# Patient Record
Sex: Female | Born: 1940 | Race: White | Hispanic: No | Marital: Married | Smoking: Never smoker
Health system: Southern US, Community
[De-identification: ages and names within clinical notes are randomized; demographics above are authoritative.]

## PROBLEM LIST (undated history)

## (undated) DIAGNOSIS — E559 Vitamin D deficiency, unspecified: Secondary | ICD-10-CM

## (undated) DIAGNOSIS — H409 Unspecified glaucoma: Secondary | ICD-10-CM

## (undated) DIAGNOSIS — E039 Hypothyroidism, unspecified: Secondary | ICD-10-CM

## (undated) DIAGNOSIS — F329 Major depressive disorder, single episode, unspecified: Secondary | ICD-10-CM

## (undated) DIAGNOSIS — I10 Essential (primary) hypertension: Secondary | ICD-10-CM

## (undated) DIAGNOSIS — F32A Depression, unspecified: Secondary | ICD-10-CM

## (undated) DIAGNOSIS — U071 COVID-19: Secondary | ICD-10-CM

## (undated) HISTORY — DX: Vitamin D deficiency, unspecified: E55.9

## (undated) HISTORY — DX: COVID-19: U07.1

## (undated) HISTORY — DX: Essential (primary) hypertension: I10

## (undated) HISTORY — PX: GLAUCOMA SURGERY: SHX656

## (undated) HISTORY — DX: Unspecified glaucoma: H40.9

## (undated) HISTORY — PX: OTHER SURGICAL HISTORY: SHX169

---

## 2001-10-23 ENCOUNTER — Other Ambulatory Visit: Admission: RE | Admit: 2001-10-23 | Discharge: 2001-10-23 | Payer: Self-pay | Admitting: Obstetrics and Gynecology

## 2002-10-15 ENCOUNTER — Ambulatory Visit (HOSPITAL_COMMUNITY): Admission: RE | Admit: 2002-10-15 | Discharge: 2002-10-15 | Payer: Self-pay | Admitting: Gastroenterology

## 2002-10-15 ENCOUNTER — Encounter (INDEPENDENT_AMBULATORY_CARE_PROVIDER_SITE_OTHER): Payer: Self-pay | Admitting: Specialist

## 2002-10-29 ENCOUNTER — Other Ambulatory Visit: Admission: RE | Admit: 2002-10-29 | Discharge: 2002-10-29 | Payer: Self-pay | Admitting: Obstetrics and Gynecology

## 2003-10-31 ENCOUNTER — Other Ambulatory Visit: Admission: RE | Admit: 2003-10-31 | Discharge: 2003-10-31 | Payer: Self-pay | Admitting: Obstetrics and Gynecology

## 2004-11-12 ENCOUNTER — Other Ambulatory Visit: Admission: RE | Admit: 2004-11-12 | Discharge: 2004-11-12 | Payer: Self-pay | Admitting: Obstetrics and Gynecology

## 2005-11-29 ENCOUNTER — Other Ambulatory Visit: Admission: RE | Admit: 2005-11-29 | Discharge: 2005-11-29 | Payer: Self-pay | Admitting: Obstetrics & Gynecology

## 2005-12-13 ENCOUNTER — Encounter: Admission: RE | Admit: 2005-12-13 | Discharge: 2005-12-13 | Payer: Self-pay | Admitting: Internal Medicine

## 2006-11-11 DIAGNOSIS — E559 Vitamin D deficiency, unspecified: Secondary | ICD-10-CM

## 2006-11-11 HISTORY — DX: Vitamin D deficiency, unspecified: E55.9

## 2006-12-07 ENCOUNTER — Other Ambulatory Visit: Admission: RE | Admit: 2006-12-07 | Discharge: 2006-12-07 | Payer: Self-pay | Admitting: Obstetrics & Gynecology

## 2006-12-12 HISTORY — PX: REFRACTIVE SURGERY: SHX103

## 2007-01-19 ENCOUNTER — Ambulatory Visit (HOSPITAL_COMMUNITY): Admission: RE | Admit: 2007-01-19 | Discharge: 2007-01-19 | Payer: Self-pay | Admitting: Ophthalmology

## 2007-04-16 ENCOUNTER — Encounter: Admission: RE | Admit: 2007-04-16 | Discharge: 2007-05-02 | Payer: Self-pay | Admitting: Occupational Medicine

## 2007-12-20 ENCOUNTER — Other Ambulatory Visit: Admission: RE | Admit: 2007-12-20 | Discharge: 2007-12-20 | Payer: Self-pay | Admitting: Obstetrics and Gynecology

## 2011-01-28 NOTE — Op Note (Signed)
NAME:  Andrea Henderson, Andrea Henderson                               ACCOUNT NO.:  [id number]   MEDICAL RECORD NO.:  [id number]                   PATIENT TYPE:  AMB   LOCATION:  ENDO                                 FACILITY:  [facility]   PHYSICIAN:  Anselmo Rod, M.D.               DATE OF BIRTH:  12/03/1940   DATE OF PROCEDURE:  10/15/2002  DATE OF DISCHARGE:                                 OPERATIVE REPORT   PROCEDURE PERFORMED:  Colonoscopy with snare polypectomy x1.   ENDOSCOPIST:  Anselmo Rod, M.D.   INSTRUMENT USED:  Olympus video colonoscope.   INDICATION FOR PROCEDURE:  [age]-year-old white female underwent  screening colonoscopy.  The patient has a family history of ovarian cancer  in a paternal aunt and has a personal history of constipation, predominant  IBS, to rule out colon polyps, masses, etc.   PREPROCEDURE PREPARATION:  Informed consent was procured from the patient.  The patient was fasted for eight hours prior to the procedure and prepped  with a bottle of MiraLax and Gatorade the night prior to the procedure.   PREPROCEDURE PHYSICAL:  VITAL SIGNS:  The patient had stable vital signs.  NECK:  Neck supple.  CHEST:  Chest clear to auscultation.  S1 and S2 regular.  ABDOMEN:  Abdomen soft with normal bowel sounds.   DESCRIPTION OF THE PROCEDURE:  The patient was placed in the left lateral  decubitus position and sedated with 50 mg of Demerol and 5 mg of Versed  intravenously.  Once the patient was adequately sedate and maintained on low-  flow oxygen and continuous cardiac monitoring, the Olympus video colonoscope  was advanced from the rectum to the cecum.  There was some residual stool in  the colon and multiple washes were done.  A small sessile polyp was snared  from the proximal right colon.  The rest of the colonic mucosa appeared  healthy and without lesions.  In areas where there was some residual stool,  very small lesions could have been missed.  The terminal  ileum appeared  healthy and without lesions.  The appendiceal orifice and ileocecal valve  were clearly visualized and photographed.  Small internal hemorrhoids were  seen on retroflexion.   IMPRESSION:  1. Small sessile polyps snared from proximal right colon.  2. Small nonbleeding internal hemorrhoids.  3. Otherwise normal colonoscopy up to terminal ileum.   RECOMMENDATIONS:  1. Await pathology results.  2.     Avoid all nonsteroidals including aspirin.  3. High fiber diet with liberal fluid intake.  4. Outpatient followup in the next two weeks for further recommendations.                                               Jyothi  Elsie Amis, M.D.    JNM/MEDQ  D:  10/15/2002  T:  10/15/2002  Job:  [id number]   cc:   Olene Craven, M.D.  [address]  [address]  [place]  [place] [address]  Fax: [phone]

## 2011-09-04 ENCOUNTER — Emergency Department (HOSPITAL_COMMUNITY)
Admission: EM | Admit: 2011-09-04 | Discharge: 2011-09-05 | Disposition: A | Discharge status: Home / Self Care | Payer: Medicare Other | Attending: Emergency Medicine | Admitting: Emergency Medicine

## 2011-09-04 DIAGNOSIS — R0989 Other specified symptoms and signs involving the circulatory and respiratory systems: Secondary | ICD-10-CM | POA: Insufficient documentation

## 2011-09-04 DIAGNOSIS — J9801 Acute bronchospasm: Secondary | ICD-10-CM | POA: Insufficient documentation

## 2011-09-04 DIAGNOSIS — H409 Unspecified glaucoma: Secondary | ICD-10-CM | POA: Insufficient documentation

## 2011-09-04 DIAGNOSIS — R0600 Dyspnea, unspecified: Secondary | ICD-10-CM

## 2011-09-04 DIAGNOSIS — R062 Wheezing: Secondary | ICD-10-CM | POA: Insufficient documentation

## 2011-09-04 DIAGNOSIS — R059 Cough, unspecified: Secondary | ICD-10-CM | POA: Insufficient documentation

## 2011-09-04 DIAGNOSIS — R0602 Shortness of breath: Secondary | ICD-10-CM | POA: Insufficient documentation

## 2011-09-04 DIAGNOSIS — R05 Cough: Secondary | ICD-10-CM | POA: Insufficient documentation

## 2011-09-04 DIAGNOSIS — F3289 Other specified depressive episodes: Secondary | ICD-10-CM | POA: Insufficient documentation

## 2011-09-04 DIAGNOSIS — Z79899 Other long term (current) drug therapy: Secondary | ICD-10-CM | POA: Insufficient documentation

## 2011-09-04 DIAGNOSIS — R0609 Other forms of dyspnea: Secondary | ICD-10-CM | POA: Insufficient documentation

## 2011-09-04 DIAGNOSIS — F329 Major depressive disorder, single episode, unspecified: Secondary | ICD-10-CM | POA: Insufficient documentation

## 2011-09-04 DIAGNOSIS — E039 Hypothyroidism, unspecified: Secondary | ICD-10-CM | POA: Insufficient documentation

## 2011-09-04 DIAGNOSIS — J3489 Other specified disorders of nose and nasal sinuses: Secondary | ICD-10-CM | POA: Insufficient documentation

## 2011-09-04 DIAGNOSIS — R0601 Orthopnea: Secondary | ICD-10-CM | POA: Insufficient documentation

## 2011-09-04 HISTORY — DX: Hypothyroidism, unspecified: E03.9

## 2011-09-04 HISTORY — DX: Depression, unspecified: F32.A

## 2011-09-04 HISTORY — DX: Major depressive disorder, single episode, unspecified: F32.9

## 2011-09-04 NOTE — ED Notes (Signed)
Pt just completed tamiflu and z pack last week

## 2011-09-04 NOTE — ED Notes (Signed)
Pt reports shortness of breath, cough, and intermittent episodes of dizziness since last Wednesday - pt states her PCP dx her w/ the flu over the phone and prescribed tamiflu. Pt w/ non-productive cough and fever last Wednesday. Pt reports some numbness to her legs, fingers, and lips that began this afternoon. Pt resting comfortably on bed in no acute distress, family at bedside.

## 2011-09-04 NOTE — ED Notes (Signed)
Dx with flu last week by PCP- pt repeat SOB since yesterday- denies chest pain

## 2011-09-05 ENCOUNTER — Emergency Department (HOSPITAL_COMMUNITY): Payer: Medicare Other

## 2011-09-05 MED ORDER — BENZONATATE 100 MG PO CAPS
200.0000 mg | ORAL_CAPSULE | Freq: Once | ORAL | Status: AC
  Administered 2011-09-05: 200 mg via ORAL

## 2011-09-05 MED ORDER — ACETAMINOPHEN 325 MG PO TABS
650.0000 mg | ORAL_TABLET | Freq: Once | ORAL | Status: AC
Start: 2011-09-05 — End: 2011-09-05
  Administered 2011-09-05: 650 mg via ORAL
  Filled 2011-09-05: qty 2

## 2011-09-05 MED ORDER — PREDNISONE 20 MG PO TABS
60.0000 mg | ORAL_TABLET | Freq: Once | ORAL | Status: AC
  Administered 2011-09-05: 60 mg via ORAL
  Filled 2011-09-05: qty 3

## 2011-09-05 MED ORDER — ALBUTEROL SULFATE (5 MG/ML) 0.5% IN NEBU
5.0000 mg | INHALATION_SOLUTION | Freq: Once | RESPIRATORY_TRACT | Status: AC
  Administered 2011-09-05: 5 mg via RESPIRATORY_TRACT
  Filled 2011-09-05 (×2): qty 1

## 2011-09-05 MED ORDER — HYDROCOD POLST-CHLORPHEN POLST 10-8 MG/5ML PO LQCR: 5.0000 mL | Freq: Once | ORAL | Status: DC

## 2011-09-05 MED ORDER — BENZONATATE 100 MG PO CAPS
ORAL_CAPSULE | ORAL | Status: AC
  Filled 2011-09-05: qty 2

## 2011-09-05 MED ORDER — ALBUTEROL SULFATE HFA 108 (90 BASE) MCG/ACT IN AERS
2.0000 | INHALATION_SPRAY | Freq: Once | RESPIRATORY_TRACT | Status: AC
  Administered 2011-09-05: 2 via RESPIRATORY_TRACT
  Filled 2011-09-05: qty 6.7

## 2011-09-05 MED ORDER — PREDNISONE 10 MG PO TABS: 20.0000 mg | ORAL_TABLET | Freq: Every day | ORAL | Status: DC

## 2011-09-05 MED ORDER — IPRATROPIUM BROMIDE 0.02 % IN SOLN
0.5000 mg | Freq: Once | RESPIRATORY_TRACT | Status: AC
  Administered 2011-09-05: 0.5 mg via RESPIRATORY_TRACT
  Filled 2011-09-05 (×2): qty 2.5

## 2011-09-05 NOTE — ED Notes (Signed)
Rx given x1 D/c instructions reviewed w/ pt and family - pt and family deny any further questions or concerns at present.  

## 2011-09-05 NOTE — ED Provider Notes (Signed)
History     CSN: [id number]  Arrival date & time 09/04/11  2308   First MD Initiated Contact with Patient 09/04/11 2335      Chief Complaint  Patient presents with  . Shortness of Breath  . URI  . Influenza    (Consider location/radiation/quality/duration/timing/severity/associated sxs/prior treatment) HPI Comments: Patient here with continued cough and shortness of breath - states was seen by PCP last week, diagnosed with bronchitis and placed on a z-pack - states this had initially helped but since yesterday she reports worsening barking cough and shortness of breath - has noticed also with wheezing - she denies chest pain, recent trips, calf pain, nausea, vomiting.  Patient is a 70 y.o. female presenting with shortness of breath, URI, and flu symptoms. The history is provided by the patient. No language interpreter was used.  Shortness of Breath  The current episode started more than 2 weeks ago. The onset was gradual. The problem occurs continuously. The problem has been gradually worsening. The problem is moderate. The symptoms are relieved by nothing. The symptoms are aggravated by nothing. Associated symptoms include orthopnea, rhinorrhea, cough, shortness of breath and wheezing. Pertinent negatives include no chest pain, no chest pressure, no fever, no sore throat and no stridor. There was no intake of a foreign body. She was not exposed to toxic fumes. She has not inhaled smoke recently. She has had no prior steroid use. She has had no prior hospitalizations. She has had no prior ICU admissions. She has had no prior intubations. She has been behaving normally. Urine output has been normal. The last void occurred less than 6 hours ago. There were no sick contacts. Recently, medical care has been given by the PCP. Services received include medications given.  URI The primary symptoms include cough and wheezing. Primary symptoms do not include fever or sore throat.  Symptoms associated  with the illness include rhinorrhea.  Influenza Associated symptoms include coughing. Pertinent negatives include no chest pain, fever or sore throat.    Past Medical History  Diagnosis Date  . Glaucoma   . Hypothyroid   . Depression     History reviewed. No pertinent past surgical history.  History reviewed. No pertinent family history.  History  Substance Use Topics  . Smoking status: Passive Smoker  . Smokeless tobacco: Not on file  . Alcohol Use: No    OB History    Grav Para Term Preterm Abortions TAB SAB Ect Mult Living                  Review of Systems  Constitutional: Negative for fever.  HENT: Positive for rhinorrhea. Negative for sore throat.   Respiratory: Positive for cough, shortness of breath and wheezing. Negative for stridor.   Cardiovascular: Positive for orthopnea. Negative for chest pain.  All other systems reviewed and are negative.    Allergies  Codeine and Sulfa antibiotics  Home Medications   Current Outpatient Rx  Name Route Sig Dispense Refill  . INDOMETHACIN 25 MG PO CAPS Oral Take 25 mg by mouth 2 (two) times daily with a meal.      . LEVOTHYROXINE SODIUM 88 MCG PO TABS Oral Take 88 mcg by mouth daily.      . SERTRALINE HCL 100 MG PO TABS Oral Take 100 mg by mouth daily.        BP 99/59  Pulse 73  Temp(Src) 97.5 F (36.4 C) (Oral)  Resp 17  Wt 130 lb (58.968 kg)  SpO2 100%  Physical Exam  Nursing note and vitals reviewed. Constitutional: She is oriented to person, place, and time. She appears well-developed and well-nourished. No distress.  HENT:  Head: Normocephalic and atraumatic.  Right Ear: External ear normal.  Left Ear: External ear normal.  Nose: Rhinorrhea present.  Mouth/Throat: Oropharynx is clear and moist. No oropharyngeal exudate.  Eyes: Conjunctivae are normal. Pupils are equal, round, and reactive to light. No scleral icterus.  Neck: Normal range of motion. Neck supple.  Cardiovascular: Normal rate,  regular rhythm and normal heart sounds.  Exam reveals no gallop and no friction rub.   No murmur heard. Pulmonary/Chest: Effort normal. No respiratory distress. She has wheezes. She has no rales. She exhibits no tenderness.  Abdominal: Soft. Bowel sounds are normal. She exhibits no distension. There is no tenderness.  Musculoskeletal: Normal range of motion.  Lymphadenopathy:    She has no cervical adenopathy.  Neurological: She is alert and oriented to person, place, and time. No cranial nerve deficit.  Skin: Skin is warm and dry.  Psychiatric: She has a normal mood and affect. Her behavior is normal. Judgment and thought content normal.    ED Course  Procedures (including critical care time)  Labs Reviewed - No data to display Dg Chest 2 View  09/05/2011  *RADIOLOGY REPORT*  Clinical Data: Cough, fever and shortness of breath for 3 days.  CHEST - 2 VIEW  Comparison: Chest radiograph performed 12/13/2005  Findings: The lungs are well-aerated.  Mild chronically increased interstitial markings are noted.  There is no evidence of focal opacification, pleural effusion or pneumothorax.  Calcified granulomata are again noted bilaterally.  The heart is normal in size; the mediastinal contour is within normal limits.  No acute osseous abnormalities are seen.  IMPRESSION: No acute cardiopulmonary process seen; mild chronic lung changes noted.  Original Report Authenticated By: Tonia Ghent, M.D.     Bronchospasm     MDM  Chest x-ray does not reveal CAP or any focal findings, oxygen saturations are normal - reports improvement in breathing and cough with breathing treatment leading me to believe this to be more of a bronchospasm type picture - will place on albuterol and start on prednisone for the next several days - she is to return with any worrisome symptoms.        Izola Price [place], [place] 09/06/11 [phone]

## 2011-09-07 NOTE — ED Provider Notes (Signed)
Medical screening examination/treatment/procedure(s) were performed by non-physician practitioner and as supervising physician I was immediately available for consultation/collaboration.  Ethelda Chick, MD 09/07/11 [phone]

## 2011-12-22 ENCOUNTER — Other Ambulatory Visit (HOSPITAL_COMMUNITY): Payer: Self-pay | Admitting: Radiology

## 2011-12-27 ENCOUNTER — Ambulatory Visit (HOSPITAL_COMMUNITY)
Admission: RE | Admit: 2011-12-27 | Discharge: 2011-12-27 | Disposition: A | Discharge status: Home / Self Care | Payer: Medicare Other | Source: Ambulatory Visit | Attending: Internal Medicine | Admitting: Internal Medicine

## 2011-12-27 DIAGNOSIS — R05 Cough: Secondary | ICD-10-CM | POA: Insufficient documentation

## 2011-12-27 DIAGNOSIS — J45909 Unspecified asthma, uncomplicated: Secondary | ICD-10-CM | POA: Insufficient documentation

## 2011-12-27 DIAGNOSIS — R059 Cough, unspecified: Secondary | ICD-10-CM | POA: Insufficient documentation

## 2011-12-27 MED ORDER — ALBUTEROL SULFATE (5 MG/ML) 0.5% IN NEBU
2.5000 mg | INHALATION_SOLUTION | Freq: Once | RESPIRATORY_TRACT | Status: AC
  Administered 2011-12-27: 2.5 mg via RESPIRATORY_TRACT

## 2012-05-21 ENCOUNTER — Encounter: Payer: Self-pay | Admitting: Cardiovascular Disease

## 2013-01-25 LAB — HM COLONOSCOPY

## 2013-02-12 ENCOUNTER — Encounter: Payer: Self-pay | Admitting: Nurse Practitioner

## 2013-02-21 ENCOUNTER — Encounter: Payer: Self-pay | Admitting: *Deleted

## 2013-03-07 ENCOUNTER — Ambulatory Visit: Payer: Self-pay | Admitting: Nurse Practitioner

## 2013-03-11 ENCOUNTER — Ambulatory Visit: Payer: Self-pay | Admitting: Nurse Practitioner

## 2013-03-27 ENCOUNTER — Ambulatory Visit (INDEPENDENT_AMBULATORY_CARE_PROVIDER_SITE_OTHER): Payer: Medicare Other | Admitting: Nurse Practitioner

## 2013-03-27 ENCOUNTER — Encounter: Payer: Self-pay | Admitting: Nurse Practitioner

## 2013-03-27 VITALS — BP 118/78 | HR 64 | Resp 14 | Ht 61.0 in | Wt 142.0 lb

## 2013-03-27 DIAGNOSIS — Z01419 Encounter for gynecological examination (general) (routine) without abnormal findings: Secondary | ICD-10-CM

## 2013-03-27 NOTE — Progress Notes (Signed)
72 y.o. G2P2 Married Caucasian Fe here for annual exam.  Feels well. Celebrated 44 th Birthday [place] past weekend with a family gathering.  Grandson who was living with her has now move to [facility]. She has been very sad about the situation and had   No LMP recorded. Patient is postmenopausal.          Sexually active: no  The current method of family planning is status post menopausal..    Exercising: no  not regularly is apart of the [employer]; does water aerobics Smoker:  no  Health Maintenance: Pap:  03/01/12 normal MMG:  01/29/13 normal Colonoscopy:  01/25/13 normal recheck 10 yrs BMD:   01/24/12- Stable TDaP:  10/19/2002 Shingles: 2-3 years ago Labs: PCP and all test normal including Vit D - now Rx given by PCP   reports that she has been passively smoking.  She does not have any smokeless tobacco history on file. She reports that she does not drink alcohol or use illicit drugs.  Past Medical History  Diagnosis Date  . Glaucoma   . Hypothyroid   . Depression   . Glaucoma     Past Surgical History  Procedure Laterality Date  . Refractive surgery Bilateral 12/2006    Current Outpatient Prescriptions  Medication Sig Dispense Refill  . Ascorbic Acid (VITAMIN C) 100 MG tablet Take 100 mg by mouth daily.      Marland Kitchen b complex vitamins tablet Take 1 tablet by mouth daily.      . calcium carbonate (OS-CAL) 600 MG TABS Take 600 mg by mouth 2 (two) times daily with a meal.      . estradiol-norethindrone (ACTIVELLA) 1-0.5 MG per tablet Take 1 tablet by mouth daily.      . fish oil-omega-3 fatty acids 1000 MG capsule Take 2 g by mouth daily.      Marland Kitchen glucosamine-chondroitin 500-400 MG tablet Take 1 tablet by mouth 3 (three) times daily.      . indapamide (LOZOL) 1.25 MG tablet Take 1.25 mg by mouth every morning.      . indomethacin (INDOCIN) 25 MG capsule Take 25 mg by mouth 2 (two) times daily with a meal.        . levothyroxine (SYNTHROID, LEVOTHROID) 88 MCG tablet Take 88 mcg by mouth  daily.        . Multiple Vitamin (MULTIVITAMIN) tablet Take 1 tablet by mouth daily.      . predniSONE (DELTASONE) 10 MG tablet Take 2 tablets (20 mg total) by mouth daily.  16 tablet  0  . sertraline (ZOLOFT) 100 MG tablet Take 100 mg by mouth daily.        . Vitamin D, Ergocalciferol, (DRISDOL) 50000 UNITS CAPS Take 50,000 Units by mouth every 7 (seven) days.      . vitamin E 100 UNIT capsule Take 100 Units by mouth daily.       No current facility-administered medications for this visit.    Family History  Problem Relation Age of Onset  . Stroke Mother     CVA  . AAA (abdominal aortic aneurysm) Father     ROS:  Pertinent items are noted in HPI.  Otherwise, a comprehensive ROS was negative.  Exam:   There were no vitals taken for this visit.    Ht Readings from Last 3 Encounters:  No data found for Ht    General appearance: alert, cooperative and appears stated age Head: Normocephalic, without obvious abnormality, atraumatic Neck: no adenopathy,  supple, symmetrical, trachea midline and thyroid normal to inspection and palpation Lungs: clear to auscultation bilaterally Breasts: normal appearance, no masses or tenderness Heart: regular rate and rhythm Abdomen: soft, non-tender; no masses,  no organomegaly Extremities: extremities normal, atraumatic, no cyanosis or edema Skin: Skin color, texture, turgor normal. No rashes or lesions Lymph nodes: Cervical, supraclavicular, and axillary nodes normal. No abnormal inguinal nodes palpated Neurologic: Grossly normal   Pelvic: External genitalia:  no lesions              Urethra:  normal appearing urethra with no masses, tenderness or lesions              Bartholin's and Skene's: normal                 Vagina: normal appearing vagina with normal color and discharge, no lesions              Cervix: anteverted              Pap taken: no Bimanual Exam:  Uterus:  normal size, contour, position, consistency, mobility, non-tender               Adnexa: no mass, fullness, tenderness               Rectovaginal: Confirms               Anus:  normal sphincter tone, no lesions  A:  Well Woman with normal exam  Postmenopausal  Vit D deficiency - now followed by PCP  Situational anxiety depression  Hypothyroid  P:   Pap smear as per guidelines   Mammogram due 01/2014  counseled on breast self exam, adequate intake of calcium and vitamin D,   diet and exercise, Kegel's exercises return annually or prn  An After Visit Summary was printed and given to the patient.

## 2013-03-27 NOTE — Patient Instructions (Signed)

## 2013-03-28 NOTE — Progress Notes (Signed)
Encounter reviewed by Dr. Brook Silva.  

## 2013-12-30 ENCOUNTER — Other Ambulatory Visit: Payer: Self-pay | Admitting: Internal Medicine

## 2013-12-30 DIAGNOSIS — M542 Cervicalgia: Secondary | ICD-10-CM

## 2014-01-06 ENCOUNTER — Ambulatory Visit
Admission: RE | Admit: 2014-01-06 | Discharge: 2014-01-06 | Disposition: A | Discharge status: Home / Self Care | Payer: 59 | Source: Ambulatory Visit | Attending: Internal Medicine | Admitting: Internal Medicine

## 2014-01-06 DIAGNOSIS — M542 Cervicalgia: Secondary | ICD-10-CM

## 2014-02-12 ENCOUNTER — Telehealth: Payer: Self-pay | Admitting: *Deleted

## 2014-02-12 NOTE — Telephone Encounter (Signed)
I have attempted to contact this patient by phone with the following results: left message to return my call on answering machine (home).  

## 2014-02-13 NOTE — Telephone Encounter (Signed)
Pt returning call

## 2014-02-14 NOTE — Telephone Encounter (Signed)
I have attempted to contact this patient by phone with the following results: left message to return my call on answering machine (home).  

## 2014-02-25 NOTE — Telephone Encounter (Signed)
Pt notified of BMD results per written note on hardcopy.  She voices understanding and is agreeable with plan.   Hard copy sent to scan.  

## 2014-04-02 ENCOUNTER — Ambulatory Visit: Payer: MEDICARE | Admitting: Certified Nurse Midwife

## 2014-04-23 ENCOUNTER — Ambulatory Visit (INDEPENDENT_AMBULATORY_CARE_PROVIDER_SITE_OTHER): Payer: Medicare Other | Admitting: Nurse Practitioner

## 2014-04-23 ENCOUNTER — Encounter: Payer: Self-pay | Admitting: Nurse Practitioner

## 2014-04-23 VITALS — BP 94/60 | HR 68 | Ht 61.0 in | Wt 143.0 lb

## 2014-04-23 DIAGNOSIS — Z01419 Encounter for gynecological examination (general) (routine) without abnormal findings: Secondary | ICD-10-CM

## 2014-04-23 DIAGNOSIS — E559 Vitamin D deficiency, unspecified: Secondary | ICD-10-CM

## 2014-04-23 NOTE — Progress Notes (Signed)
Patient ID: Andrea Henderson, female   DOB: 10-24-1940, 73 y.o.   MRN: [id number] 73 y.o. G2P2 Married Caucasian Fe here for annual exam.  She feels well and was able to SUB for 3 months last year.  She is still missing her grandson [place] [facility] [place].  She has been able to see him but less often.  She has been less motivated to be as active this past year.  She did have a repeat BMD and had 5.6 % loss at her left hip.  PCP wants her to take Fosomax and she is opposed to taking at this time.  Patient's last menstrual period was 09/12/2000.           Sexually active: no   The current method of family planning is status post menopausal..     Exercising: not regularly Smoker:  no  Health Maintenance: Pap:  03/01/12 normal MMG:  01/30/14, 3D, normal Colonoscopy:  01/25/13 normal recheck 10 yrs BMD:   01/30/14 TR Score: spine -1.3; left hip neck -1.7; total -1.3 TD:  10/19/2002 Shingles: 2-3 years ago Labs:  12/2013 PCP and all test normal including Vit D - now Rx given by PCP    reports that she has been passively smoking.  She has never used smokeless tobacco. She reports that she does not drink alcohol or use illicit drugs.  Past Medical History  Diagnosis Date  . Glaucoma   . Depression   . Glaucoma   . Hypothyroid 20 yrs ago  . Vitamin D deficiency disease 3/08    Past Surgical History  Procedure Laterality Date  . Refractive surgery Bilateral 12/2006    Current Outpatient Prescriptions  Medication Sig Dispense Refill  . calcium carbonate (OS-CAL) 600 MG TABS Take 600 mg by mouth at bedtime.       . fish oil-omega-3 fatty acids 1000 MG capsule Take 2 g by mouth daily.      . fluticasone (FLONASE) 50 MCG/ACT nasal spray Place 2 sprays into both nostrils 2 (two) times daily.       Marland Kitchen. glucosamine-chondroitin 500-400 MG tablet Take 1 tablet by mouth 3 (three) times daily.      . indapamide (LOZOL) 1.25 MG tablet Take 1.25 mg by mouth every other day.       . levothyroxine  (SYNTHROID, LEVOTHROID) 88 MCG tablet Take 88 mcg by mouth daily.        . sertraline (ZOLOFT) 50 MG tablet Take 50 mg by mouth daily.      Marland Kitchen. UNABLE TO FIND Med Name: Isotonix- Includes Anti Oxidant Mixture, Multivitamin, B complex, Vitamin E      . Vitamin D, Ergocalciferol, (DRISDOL) 50000 UNITS CAPS Take 50,000 Units by mouth every 14 (fourteen) days.        No current facility-administered medications for this visit.    Family History  Problem Relation Age of Onset  . Stroke Mother     CVA  . Mitral valve prolapse Mother   . AAA (abdominal aortic aneurysm) Father   . Mitral valve prolapse Sister   . Atrial fibrillation Sister   . Breast cancer  47    neice    ROS:  Pertinent items are noted in HPI.  Otherwise, a comprehensive ROS was negative.  Exam:   BP 94/60  Pulse 68  Ht 5\' 1"  (1.549 m)  Wt 143 lb (64.864 kg)  BMI 27.03 kg/m2  LMP 09/12/2000 Height: 5\' 1"  (154.9 cm)  Ht Readings from  Last 3 Encounters:  04/23/14 5\' 1"  (1.549 m)  03/27/13 5\' 1"  (1.549 m)    General appearance: alert, cooperative and appears stated age Head: Normocephalic, without obvious abnormality, atraumatic Neck: no adenopathy, supple, symmetrical, trachea midline and thyroid normal to inspection and palpation Lungs: clear to auscultation bilaterally Breasts: normal appearance, no masses or tenderness Heart: regular rate and rhythm Abdomen: soft, non-tender; no masses,  no organomegaly Extremities: extremities normal, atraumatic, no cyanosis or edema Skin: Skin color, texture, turgor normal. No rashes or lesions Lymph nodes: Cervical, supraclavicular, and axillary nodes normal. No abnormal inguinal nodes palpated Neurologic: Grossly normal   Pelvic: External genitalia:  no lesions              Urethra:  normal appearing urethra with no masses, tenderness or lesions              Bartholin's and Skene's: normal                 Vagina: normal appearing vagina with normal color and discharge,  no lesions              Cervix: anteverted              Pap taken: No. Bimanual Exam:  Uterus:  normal size, contour, position, consistency, mobility, non-tender              Adnexa: no mass, fullness, tenderness               Rectovaginal: Confirms               Anus:  normal sphincter tone, no lesions  A:  Well Woman with normal exam  Postmenopausal off HRT 02/2011  Osteopenia  History of Vit D deficiency   P:   Reviewed health and wellness pertinent to exam  Pap smear not taken today  Mammogram is due 01/2015  She is now on an OTC vitamin and is getting about 2,000 IU of Vit d daily advised she can come off Vit D RX.  Counseled on breast self exam, mammography screening, adequate intake of calcium and vitamin D, diet and exercise return annually or prn  An After Visit Summary was printed and given to the patient.

## 2014-04-23 NOTE — Patient Instructions (Signed)

## 2014-04-27 NOTE — Progress Notes (Signed)
Encounter reviewed by Dr. Roselie Cirigliano Silva.  

## 2014-07-14 ENCOUNTER — Encounter: Payer: Self-pay | Admitting: Nurse Practitioner

## 2015-04-28 ENCOUNTER — Encounter: Payer: Self-pay | Admitting: Nurse Practitioner

## 2015-04-28 ENCOUNTER — Ambulatory Visit (INDEPENDENT_AMBULATORY_CARE_PROVIDER_SITE_OTHER): Payer: Medicare Other | Admitting: Nurse Practitioner

## 2015-04-28 VITALS — BP 112/70 | HR 60 | Resp 16 | Ht 61.25 in | Wt 142.0 lb

## 2015-04-28 DIAGNOSIS — Z01419 Encounter for gynecological examination (general) (routine) without abnormal findings: Secondary | ICD-10-CM | POA: Diagnosis not present

## 2015-04-28 DIAGNOSIS — Z0000 Encounter for general adult medical examination without abnormal findings: Secondary | ICD-10-CM | POA: Diagnosis not present

## 2015-04-28 LAB — POCT URINALYSIS DIPSTICK
Bilirubin, UA: NEGATIVE
Blood, UA: NEGATIVE
GLUCOSE UA: NEGATIVE
Ketones, UA: NEGATIVE
Leukocytes, UA: NEGATIVE
Nitrite, UA: NEGATIVE
Protein, UA: NEGATIVE
UROBILINOGEN UA: NEGATIVE
pH, UA: 7

## 2015-04-28 NOTE — Progress Notes (Signed)
74 y.o. G2P2 Married  Caucasian Fe here for annual exam. Doing well.  She has just had AEX with PCP and he has given her new BP med - she is concerned as her BP is very low at home.  She has decided she will monitor with a BP log and then consult with him again.  Her husband has had a mouth cancer and was to have more surgery when he was found to have heart disease and had 4 vessel bypass surgery in June.  The grandson and son have moved back to [place]. She has noted a slight incresase in urinary urgency.  Patient's last menstrual period was 09/12/2000.          Sexually active: No.  The current method of family planning is post menopausal status.    Exercising: No.  The patient does not participate in regular exercise at present. Smoker:  no  Health Maintenance: Pap:  03/01/12 Neg MMG:  01/2014 BIRADS1:neg Colonoscopy:  01/2013 Normal - repeat 10 years  BMD:   01/2014  TDaP:  2004 Shingles:  Age 58 Prevnar 13: is due 06/2015 Labs: PCP    Urine: negative   reports that she has been passively smoking.  She has never used smokeless tobacco. She reports that she does not drink alcohol or use illicit drugs.  Past Medical History  Diagnosis Date  . Glaucoma   . Depression   . Glaucoma   . Hypothyroid 20 yrs ago  . Vitamin D deficiency disease 3/08    Past Surgical History  Procedure Laterality Date  . Refractive surgery Bilateral 12/2006    Current Outpatient Prescriptions  Medication Sig Dispense Refill  . ALPRAZolam (XANAX) 0.25 MG tablet Take 0.25 mg by mouth at bedtime as needed for anxiety.    . calcium carbonate (OS-CAL) 600 MG TABS Take 600 mg by mouth at bedtime.     . [facility] TUSSIN DM COUGH/CHEST 10-200 MG/5ML LIQD TAKE 1 TEASPOON BY MOUTH UP TO 4 TIMES DAILY AS NEEDED FOR COUGH  0  . fish oil-omega-3 fatty acids 1000 MG capsule Take 2 g by mouth daily.    . fluticasone (FLONASE) 50 MCG/ACT nasal spray Place 2 sprays into both nostrils 2 (two) times daily.     Marland Kitchen  glucosamine-chondroitin 500-400 MG tablet Take 1 tablet by mouth 3 (three) times daily.    . indapamide (LOZOL) 1.25 MG tablet Take 1.25 mg by mouth every other day.     . levothyroxine (SYNTHROID, LEVOTHROID) 88 MCG tablet Take 88 mcg by mouth daily.      Marland Kitchen losartan-hydrochlorothiazide (HYZAAR) 50-12.5 MG per tablet Take 1 tablet by mouth daily.    Marland Kitchen omeprazole (PRILOSEC) 20 MG capsule Take by mouth 2 (two) times daily.  3  . sertraline (ZOLOFT) 50 MG tablet Take 50 mg by mouth daily.    Marland Kitchen UNABLE TO FIND Med Name: Isotonix- Includes Anti Oxidant Mixture, Multivitamin, B complex, Vitamin E    . Vitamin D, Ergocalciferol, (DRISDOL) 50000 UNITS CAPS Take 50,000 Units by mouth every 14 (fourteen) days.      No current facility-administered medications for this visit.    Family History  Problem Relation Age of Onset  . Stroke Mother     CVA  . Mitral valve prolapse Mother   . AAA (abdominal aortic aneurysm) Father   . Mitral valve prolapse Sister   . Atrial fibrillation Sister   . Breast cancer  47    neice    ROS:  Pertinent items are noted in HPI.  Otherwise, a comprehensive ROS was negative.  Exam:   BP 112/70 mmHg  Pulse 60  Resp 16  Ht 5' 1.25" (1.556 m)  Wt 142 lb (64.411 kg)  BMI 26.60 kg/m2  LMP 09/12/2000 Height: 5' 1.25" (155.6 cm) Ht Readings from Last 3 Encounters:  04/28/15 5' 1.25" (1.556 m)  04/23/14  (1.549 m)  03/27/13  (1.549 m)    General appearance: alert, cooperative and appears stated age Head: Normocephalic, without obvious abnormality, atraumatic Neck: no adenopathy, supple, symmetrical, trachea midline and thyroid normal to inspection and palpation Lungs: clear to auscultation bilaterally Breasts: normal appearance, no masses or tenderness Heart: regular rate and rhythm Abdomen: soft, non-tender; no masses,  no organomegaly Extremities: extremities normal, atraumatic, no cyanosis or edema Skin: Skin color, texture, turgor normal. No  rashes or lesions Lymph nodes: Cervical, supraclavicular, and axillary nodes normal. No abnormal inguinal nodes palpated Neurologic: Grossly normal   Pelvic: External genitalia:  no lesions              Urethra:  normal appearing urethra with no masses, tenderness or lesions              Bartholin's and Skene's: normal                 Vagina: normal appearing vagina with normal color and discharge, no lesions              Cervix: anteverted              Pap taken: Yes.   Bimanual Exam:  Uterus:  normal size, contour, position, consistency, mobility, non-tender              Adnexa: no mass, fullness, tenderness               Rectovaginal: Confirms               Anus:  normal sphincter tone, no lesions  Chaperone present: Yes  A:  Well Woman with normal exam  Postmenopausal off HRT 02/2011 Osteopenia History of Vit D deficiency   P:   Reviewed health and wellness pertinent to exam  Pap smear as above  Mammogram i s due 2017 - will get copy of one for this year from [facility] counseled on breast self exam, mammography screening, adequate intake of calcium and vitamin D, diet and exercise, Kegel's exercises return annually or prn  An After Visit Summary was printed and given to the patient.

## 2015-04-28 NOTE — Progress Notes (Signed)
Encounter reviewed by Dr. Ree Alcalde Amundson C. Silva.  

## 2015-04-28 NOTE — Patient Instructions (Signed)

## 2015-04-30 LAB — IPS PAP SMEAR ONLY

## 2015-05-06 ENCOUNTER — Telehealth: Payer: Self-pay | Admitting: Nurse Practitioner

## 2015-05-06 NOTE — Telephone Encounter (Signed)
Patient called back and is appreciative of Ms. Patty stayin gon top of this. She said she has a appointment with Dr. Eloise Harman in the near future and will get her tdap along with her flu shot done with his office.

## 2015-05-06 NOTE — Telephone Encounter (Signed)
Pt. Is called to inform her that Immunization record from Dr. Jarold Motto reveals that she has not had an update on tetanus since 2004.  She now needs TDaP.  She may return here for that update or get at PCP.

## 2016-04-27 ENCOUNTER — Telehealth: Payer: Self-pay | Admitting: Nurse Practitioner

## 2016-04-27 NOTE — Telephone Encounter (Signed)
Patient is asking to talk with Andrea CushingPatty Grubb's nurse before coming to her aex appointment 05/02/16. Patient may want to cancel after talking with the nurse.

## 2016-04-27 NOTE — Telephone Encounter (Signed)
Spoke with patient. Patient states that she is calling to check to see if she needs to keep her aex as scheduled. "I am 75 and I am not sure that I need to have an exam any more. I see an internist so I was not sure." Reports internist does not perform pelvic or breast exam yearly. Advised it is recommended that she have a yearly pelvic and breast exam even if pap is due every 3 years. Patient is agreeable and will keep her aex appointment as scheduled with Ria CommentPatricia Grubb, FNP on 05/02/2016.  Routing to provider for final review. Patient agreeable to disposition. Will close encounter.

## 2016-05-02 ENCOUNTER — Ambulatory Visit: Payer: Medicare Other | Admitting: Nurse Practitioner

## 2016-05-04 ENCOUNTER — Encounter: Payer: Self-pay | Admitting: Nurse Practitioner

## 2016-05-04 ENCOUNTER — Ambulatory Visit (INDEPENDENT_AMBULATORY_CARE_PROVIDER_SITE_OTHER): Payer: Medicare Other | Admitting: Nurse Practitioner

## 2016-05-04 ENCOUNTER — Encounter: Payer: Medicare Other | Admitting: Nurse Practitioner

## 2016-05-04 VITALS — BP 126/76 | HR 64 | Ht 61.0 in | Wt 136.0 lb

## 2016-05-04 DIAGNOSIS — M1990 Unspecified osteoarthritis, unspecified site: Secondary | ICD-10-CM

## 2016-05-04 DIAGNOSIS — Z01419 Encounter for gynecological examination (general) (routine) without abnormal findings: Secondary | ICD-10-CM

## 2016-05-04 DIAGNOSIS — Z0000 Encounter for general adult medical examination without abnormal findings: Secondary | ICD-10-CM | POA: Diagnosis not present

## 2016-05-04 DIAGNOSIS — E039 Hypothyroidism, unspecified: Secondary | ICD-10-CM | POA: Diagnosis not present

## 2016-05-04 NOTE — Patient Instructions (Signed)

## 2016-05-04 NOTE — Progress Notes (Signed)
Patient ID: Andrea Henderson, female   DOB: [birth year], 75 y.o.   MRN: [id number]  75 y.o. 442P2002 Married  Caucasian Fe here for annual exam.  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  Now on a short course of steroids.   Patient's last menstrual period was 09/12/2000.          Sexually active: No.  The current method of family planning is post menopausal status.    Exercising: No.  The patient does not participate in regular exercise at present. Smoker:  no  Health Maintenance: Pap: 04/28/15, Negative MMG: 6/9//2017 Bi Rad: 1 - normal Colonoscopy:  01/25/13 normal recheck 10 yrs BMD:   02/19/2016: T score spine -1.20; right hip neck -2.20; left hip neck -2.0;   01/30/14, T Score, -1.3 Spine / -1.7 Right Femur Neck / -1.4 Left Femur Neck TDaP:  10/19/2002 Shingles: age 75 Pneumonia: 12/03/10, ? Prevnar 13 Hep C and HIV: Not indicated due to age Labs: PCP takes care of all labs   reports that she is a non-smoker but has been exposed to tobacco smoke. She has never used smokeless tobacco. She reports that she does not drink alcohol or use drugs.  Past Medical History:  Diagnosis Date  . Depression   . Glaucoma   . Glaucoma   . Hypothyroid 20 yrs ago  . Vitamin D deficiency disease 3/08    Past Surgical History:  Procedure Laterality Date  . REFRACTIVE SURGERY Bilateral 12/2006    Current Outpatient Prescriptions  Medication Sig Dispense Refill  . ALPRAZolam (XANAX) 0.25 MG tablet Take 0.25 mg by mouth at bedtime as needed for anxiety.    . calcium carbonate (OS-CAL) 600 MG TABS Take 600 mg by mouth at bedtime.     . [facility] TUSSIN DM COUGH/CHEST 10-200 MG/5ML LIQD TAKE 1 TEASPOON BY MOUTH UP TO 4 TIMES DAILY AS NEEDED FOR COUGH  0  . fish oil-omega-3 fatty acids 1000 MG capsule Take 2 g by mouth daily.    . fluticasone (FLONASE) 50 MCG/ACT nasal spray Place 2 sprays into both nostrils 2 (two) times daily.     Marland Kitchen. glucosamine-chondroitin 500-400 MG tablet Take 1 tablet by  mouth 3 (three) times daily.    . indapamide (LOZOL) 1.25 MG tablet Take 1.25 mg by mouth every other day.     . levothyroxine (SYNTHROID, LEVOTHROID) 88 MCG tablet Take 88 mcg by mouth daily.      Marland Kitchen. losartan-hydrochlorothiazide (HYZAAR) 50-12.5 MG per tablet Take 1 tablet by mouth daily.    Marland Kitchen. omeprazole (PRILOSEC) 20 MG capsule Take by mouth 2 (two) times daily.  3  . sertraline (ZOLOFT) 50 MG tablet Take 50 mg by mouth daily.    Marland Kitchen. UNABLE TO FIND Med Name: Isotonix- Includes Anti Oxidant Mixture, Multivitamin, B complex, Vitamin E    . Vitamin D, Ergocalciferol, (DRISDOL) 50000 UNITS CAPS Take 50,000 Units by mouth every 14 (fourteen) days.      No current facility-administered medications for this visit.     Family History  Problem Relation Age of Onset  . Stroke Mother     CVA  . Mitral valve prolapse Mother   . AAA (abdominal aortic aneurysm) Father   . Mitral valve prolapse Sister   . Atrial fibrillation Sister   . Breast cancer  47    neice    ROS:  Pertinent items are noted in HPI.  Otherwise, a comprehensive ROS was negative.  Exam:  LMP 09/12/2000    Ht Readings from Last 3 Encounters:  04/28/15 5' 1.25" (1.556 m)  04/23/14 5\' 1"  (1.549 m)  03/27/13 5\' 1"  (1.549 m)    General appearance: alert, cooperative and appears stated age Head: Normocephalic, without obvious abnormality, atraumatic Neck: no adenopathy, supple, symmetrical, trachea midline and thyroid normal to inspection and palpation Lungs: clear to auscultation bilaterally Breasts: normal appearance, no masses or tenderness Heart: regular rate and rhythm Abdomen: soft, non-tender; no masses,  no organomegaly Extremities: extremities normal, atraumatic, no cyanosis or edema Skin: Skin color, texture, turgor normal. No rashes or lesions Lymph nodes: Cervical, supraclavicular, and axillary nodes normal. No abnormal inguinal nodes palpated Neurologic: Grossly normal   Pelvic: External genitalia:  no  lesions              Urethra:  normal appearing urethra with no masses, tenderness or lesions              Bartholin's and Skene's: normal                 Vagina: normal appearing vagina with normal color and discharge, no lesions              Cervix: anteverted              Pap taken: No. Bimanual Exam:  Uterus:  normal size, contour, position, consistency, mobility, non-tender              Adnexa: no mass, fullness, tenderness               Rectovaginal: Confirms               Anus:  normal sphincter tone, no lesions  Chaperone present: yes  A:  Well Woman with normal exam  Postmenopausal off HRT 02/2011 Osteopenia History of Vit D deficiency   P:   Reviewed health and wellness pertinent to exam  Pap smear as above  Mammogram is due 02/2017  Discussed BMD with pt while here - given exercise that she can do at home or gym. Counseled on breast self exam, mammography screening, adequate intake of calcium and vitamin D, diet and exercise return annually or prn  An After Visit Summary was printed and given to the patient.

## 2016-05-04 NOTE — Progress Notes (Signed)
This encounter was created in error - please disregard.

## 2016-05-04 NOTE — Progress Notes (Deleted)
Patient ID: Andrea Henderson, female   DOB: Sep 06, 1941, 75 y.o.   MRN: [id number]  75 y.o. G2P2002 Married  CaucasiYisroel Rammingan Fe here for annual exam.    Patient's last menstrual period was 09/12/2000.          Sexually active: {yes no:314532}  The current method of family planning is {contraception:315051}.    Exercising: {yes no:314532}  {types:19826} Smoker:  {YES [id number]:22349}  Health Maintenance: Pap: 04/28/15, Negative MMG: 02/03/15,3D, Bi-Rads 1:  Negative  Colonoscopy:  01/25/13 normal recheck 10 yrs BMD:   01/30/14, T Score, -1.3 Spine / -1.7 Right Femur Neck / -1.4 Left Femur Neck TDaP:  10/19/2002 Shingles: age 75 Pneumonia: 12/03/10, ? prevnar 13 Hep C and HIV: Not indicated due to age Labs: PCP takes care of all labs   reports that she is a non-smoker but has been exposed to tobacco smoke. She has never used smokeless tobacco. She reports that she does not drink alcohol or use drugs.  Past Medical History:  Diagnosis Date  . Depression   . Glaucoma   . Glaucoma   . Hypothyroid 20 yrs ago  . Vitamin D deficiency disease 3/08    Past Surgical History:  Procedure Laterality Date  . REFRACTIVE SURGERY Bilateral 12/2006    Current Outpatient Prescriptions  Medication Sig Dispense Refill  . ALPRAZolam (XANAX) 0.25 MG tablet Take 0.25 mg by mouth at bedtime as needed for anxiety.    . calcium carbonate (OS-CAL) 600 MG TABS Take 600 mg by mouth at bedtime.     . [facility]S TUSSIN DM COUGH/CHEST 10-200 MG/5ML LIQD TAKE 1 TEASPOON BY MOUTH UP TO 4 TIMES DAILY AS NEEDED FOR COUGH  0  . fish oil-omega-3 fatty acids 1000 MG capsule Take 2 g by mouth daily.    . fluticasone (FLONASE) 50 MCG/ACT nasal spray Place 2 sprays into both nostrils 2 (two) times daily.     Marland Kitchen. glucosamine-chondroitin 500-400 MG tablet Take 1 tablet by mouth 3 (three) times daily.    . indapamide (LOZOL) 1.25 MG tablet Take 1.25 mg by mouth every other day.     . levothyroxine (SYNTHROID, LEVOTHROID) 88 MCG tablet Take 88 mcg by mouth  daily.      Marland Kitchen. losartan-hydrochlorothiazide (HYZAAR) 50-12.5 MG per tablet Take 1 tablet by mouth daily.    Marland Kitchen. omeprazole (PRILOSEC) 20 MG capsule Take by mouth 2 (two) times daily.  3  . sertraline (ZOLOFT) 50 MG tablet Take 50 mg by mouth daily.    Marland Kitchen. UNABLE TO FIND Med Name: Isotonix- Includes Anti Oxidant Mixture, Multivitamin, B complex, Vitamin E    . Vitamin D, Ergocalciferol, (DRISDOL) 50000 UNITS CAPS Take 50,000 Units by mouth every 14 (fourteen) days.      No current facility-administered medications for this visit.     Family History  Problem Relation Age of Onset  . Stroke Mother     CVA  . Mitral valve prolapse Mother   . AAA (abdominal aortic aneurysm) Father   . Mitral valve prolapse Sister   . Atrial fibrillation Sister   . Breast cancer  47    neice    ROS:  Pertinent items are noted in HPI.  Otherwise, a comprehensive ROS was negative.  Exam:   LMP 09/12/2000    Ht Readings from Last 3 Encounters:  04/28/15 5' 1.25" (1.556 m)  04/23/14 5\' 1"  (1.549 m)  03/27/13 5\' 1"  (1.549 m)    General appearance: alert, cooperative and appears stated age Head: Normocephalic,  without obvious abnormality, atraumatic Neck: no adenopathy, supple, symmetrical, trachea midline and thyroid {EXAM; THYROID:18604} Lungs: clear to auscultation bilaterally Breasts: {Exam; breast:13139::"normal appearance, no masses or tenderness"} Heart: regular rate and rhythm Abdomen: soft, non-tender; no masses,  no organomegaly Extremities: extremities normal, atraumatic, no cyanosis or edema Skin: Skin color, texture, turgor normal. No rashes or lesions Lymph nodes: Cervical, supraclavicular, and axillary nodes normal. No abnormal inguinal nodes palpated Neurologic: Grossly normal   Pelvic: External genitalia:  no lesions              Urethra:  normal appearing urethra with no masses, tenderness or lesions              Bartholin's and Skene's: normal                 Vagina: normal  appearing vagina with normal color and discharge, no lesions              Cervix: {exam; cervix:14595}              Pap taken: {yes no:314532} Bimanual Exam:  Uterus:  {exam; uterus:12215}              Adnexa: {exam; adnexa:12223}               Rectovaginal: Confirms               Anus:  normal sphincter tone, no lesions  Chaperone present: ***  A:  Well Woman with normal exam  P:   Reviewed health and wellness pertinent to exam  Pap smear as above  {plan; gyn:5269::"mammogram","pap smear","return annually or prn"}  An After Visit Summary was printed and given to the patient.

## 2016-05-06 NOTE — Progress Notes (Signed)
Reviewed personally.  M. Suzanne Shamieka Gullo, MD.  

## 2016-07-16 LAB — GLUCOSE, POCT (MANUAL RESULT ENTRY): POC Glucose: 129 mg/dl — AB (ref 70–99)

## 2016-10-17 ENCOUNTER — Telehealth: Payer: Self-pay | Admitting: Nurse Practitioner

## 2016-10-17 ENCOUNTER — Ambulatory Visit (INDEPENDENT_AMBULATORY_CARE_PROVIDER_SITE_OTHER): Payer: Medicare Other | Admitting: Nurse Practitioner

## 2016-10-17 ENCOUNTER — Encounter: Payer: Self-pay | Admitting: Nurse Practitioner

## 2016-10-17 VITALS — BP 110/78 | HR 58 | Resp 14 | Ht 61.0 in | Wt 141.6 lb

## 2016-10-17 DIAGNOSIS — N644 Mastodynia: Secondary | ICD-10-CM | POA: Diagnosis not present

## 2016-10-17 DIAGNOSIS — I499 Cardiac arrhythmia, unspecified: Secondary | ICD-10-CM

## 2016-10-17 NOTE — Telephone Encounter (Signed)
Patient found a "soreness" in her breast under her right arm.  Not sure if she should get it looked at with us or have a mammogram or see her PCP.  Patient states there is no lump just sore.

## 2016-10-17 NOTE — Telephone Encounter (Signed)
Spoke with patient. Patient states that she began having soreness under her right arm close to her breast last week. Does not feel a lump in the breast. Denies swelling, redness, warmth, or nipple discharge. States the soreness if not worsening, but is not changing. Advised she will need to be seen in the office for a breast check with Ria CommentPatricia Grubb, FNP. Patient is agreeable. Appointment scheduled for today 10/17/2016 at 3 pm. Patient is agreeable to date and time.  Routing to provider for final review. Patient agreeable to disposition. Will close encounter.

## 2016-10-17 NOTE — Progress Notes (Signed)
Scheduled patient while in office for right diagnostic mamm[facility] on [facility] at 10:15 am. Patient declines earlier appointments due to times offered. [facility] representative is going to speak with radiology tech and contact the patient with earlier time if they can work her in during times she is available. Placed in mammogram hold.

## 2016-10-17 NOTE — Progress Notes (Signed)
Patient ID: Andrea Henderson, female   DOB: April 03, 1941, 76 y.o.   MRN: [id number]  Patient here for R breast pain. Noticed 2 weeks ago.   Subjective:   76 y.o. Married Caucasian female presents for evaluation of right axilla pain. Onset of the symptoms was 2 weeks ago. Patient sought evaluation because of breast tenderness.  Contributing factors include family hx on mother's side. Niece age 74 with breast cancer treated with surgery and chemo- survives.  Paternal aunt with OV cancer early 74's and passed.  Denies fatigue. Patient denies history of trauma, bites, or injuries. [facility] and was normal.  Previous evaluation has included previous US yrs ago which was then diagnosed as a cyst.  Nurse when doing VS noted an abnormal heart rate.  Pt denies chest pain, dyspnea, palpitations, etc.  She has seen a cardiologist in the past ?  Her husband sees one and she would like to see the same one - Dr. Jacinto HalimGanji.   Review of Systems Pertinent items are noted in HPI.@SUBJECTIVE    Objective:   General appearance: alert, cooperative and no distress Head: Normocephalic, without obvious abnormality, atraumatic Neck: no adenopathy, supple, symmetrical, trachea midline and thyroid not enlarged, symmetric, no tenderness/mass/nodules Back: symmetric, no curvature. ROM normal. No CVA tenderness. Lungs: clear to auscultation bilaterally Breasts: normal appearance, no masses or tenderness, positive findings: on the left.  Right Breast - with tenderness from the axilla to outer quadrant of right breast.  No mass can be felt and no nodes. Heart: regularly irregular rhythm, about every 7-9 beats there is either PAC or PVC.  No other murmur noted. Abdomen: normal findings: no organomegaly and soft, non-tender    Assessment:   ASSESSMENT:Patient is diagnosed with mastalgia    Patient with irregular heart rate.   Plan:   PLAN: The patient has a documented plan to follow with further care of a  diagnostic plan on 11/03/16 as she has some time restrictions.  Will also refer pt to cardiologist for further evaluation 2. PLAN: FOLLOW as needed

## 2016-10-17 NOTE — Patient Instructions (Signed)
Will schedule mammogram and US on right breast. Will schedule cardio eval with MD of your choice.

## 2016-10-18 ENCOUNTER — Encounter: Payer: Self-pay | Admitting: Nurse Practitioner

## 2016-10-18 NOTE — Progress Notes (Signed)
Encounter reviewed by Dr. Brook Amundson C. Silva.  

## 2016-10-30 ENCOUNTER — Emergency Department (HOSPITAL_COMMUNITY)
Admission: EM | Admit: 2016-10-30 | Discharge: 2016-10-31 | Disposition: A | Discharge status: Home / Self Care | Payer: Medicare Other | Attending: Emergency Medicine | Admitting: Emergency Medicine

## 2016-10-30 ENCOUNTER — Emergency Department (HOSPITAL_COMMUNITY): Payer: Medicare Other

## 2016-10-30 ENCOUNTER — Encounter (HOSPITAL_COMMUNITY): Payer: Self-pay | Admitting: Emergency Medicine

## 2016-10-30 DIAGNOSIS — Z7722 Contact with and (suspected) exposure to environmental tobacco smoke (acute) (chronic): Secondary | ICD-10-CM | POA: Diagnosis not present

## 2016-10-30 DIAGNOSIS — Z79899 Other long term (current) drug therapy: Secondary | ICD-10-CM | POA: Diagnosis not present

## 2016-10-30 DIAGNOSIS — E039 Hypothyroidism, unspecified: Secondary | ICD-10-CM | POA: Insufficient documentation

## 2016-10-30 DIAGNOSIS — Y999 Unspecified external cause status: Secondary | ICD-10-CM | POA: Insufficient documentation

## 2016-10-30 DIAGNOSIS — S0083XA Contusion of other part of head, initial encounter: Secondary | ICD-10-CM | POA: Diagnosis not present

## 2016-10-30 DIAGNOSIS — W1830XA Fall on same level, unspecified, initial encounter: Secondary | ICD-10-CM | POA: Insufficient documentation

## 2016-10-30 DIAGNOSIS — S8992XA Unspecified injury of left lower leg, initial encounter: Secondary | ICD-10-CM | POA: Diagnosis present

## 2016-10-30 DIAGNOSIS — S82832A Other fracture of upper and lower end of left fibula, initial encounter for closed fracture: Secondary | ICD-10-CM | POA: Insufficient documentation

## 2016-10-30 DIAGNOSIS — S0990XA Unspecified injury of head, initial encounter: Secondary | ICD-10-CM | POA: Insufficient documentation

## 2016-10-30 DIAGNOSIS — Y939 Activity, unspecified: Secondary | ICD-10-CM | POA: Diagnosis not present

## 2016-10-30 DIAGNOSIS — Y92007 Garden or yard of unspecified non-institutional (private) residence as the place of occurrence of the external cause: Secondary | ICD-10-CM | POA: Insufficient documentation

## 2016-10-30 MED ORDER — IBUPROFEN 400 MG PO TABS: 400.0000 mg | ORAL_TABLET | Freq: Four times a day (QID) | ORAL | 0 refills | Status: DC | PRN

## 2016-10-30 MED ORDER — OXYCODONE-ACETAMINOPHEN 5-325 MG PO TABS: 2.0000 | ORAL_TABLET | ORAL | 0 refills | Status: DC | PRN

## 2016-10-30 MED ORDER — IBUPROFEN 200 MG PO TABS
600.0000 mg | ORAL_TABLET | Freq: Once | ORAL | Status: AC
  Administered 2016-10-31: 600 mg via ORAL
  Filled 2016-10-30: qty 3

## 2016-10-30 MED ORDER — OXYCODONE-ACETAMINOPHEN 5-325 MG PO TABS
1.0000 | ORAL_TABLET | Freq: Once | ORAL | Status: AC
  Administered 2016-10-30: 1 via ORAL
  Filled 2016-10-30: qty 1

## 2016-10-30 NOTE — ED Triage Notes (Signed)
Pt tripped over drop-off between asphalt and grass in front yard; pt has bruise noted to right eyebrow; denies LOC; pt c/o left ankle pain; deformity noted; no active bleeding to forehead or ankle; pulses of left foot intact; denies other injury

## 2016-10-30 NOTE — ED Notes (Signed)
Bed: WA03 Expected date:  Expected time:  Means of arrival:  Comments: 76 yo F/ Fall
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
## 2016-10-31 MED ORDER — BACITRACIN ZINC 500 UNIT/GM EX OINT
TOPICAL_OINTMENT | CUTANEOUS | Status: AC
  Administered 2016-10-31: 1
  Filled 2016-10-31: qty 0.9

## 2016-12-12 ENCOUNTER — Telehealth: Payer: Self-pay | Admitting: *Deleted

## 2016-12-12 ENCOUNTER — Encounter: Payer: Self-pay | Admitting: Nurse Practitioner

## 2016-12-12 NOTE — Telephone Encounter (Signed)
Call to patient regarding cardiology referral. Per ROI, can leave detailed message. Left message to call back to sally or becky with update.

## 2016-12-12 NOTE — Telephone Encounter (Signed)
Andrea Henderson returned Andrea Henderson's call. Kennon Rounds unavailable at time of call. Spoke with Andrea Henderson. Reviewed attempts to contact. Andrea Henderson states she spoke with Britta Mccreedy at Dr Verl Dicker office twice and reviewed need to defer. Recently fell and broke her ankle. Just got her boot removed. Doing better. Will contact them to schedule in next couple of weeks. Note to [place]. Defer referral until last week of April to follow up.

## 2016-12-12 NOTE — Telephone Encounter (Signed)
Routing to Dr Edward Jolly for review while Alexia Freestone out of office.

## 2016-12-12 NOTE — Telephone Encounter (Signed)
Additional notes to previous phone call with patient. Notes she has checked her pulse regularly, including this morning, and reports no irregularities. Regarding her fall, she stated she does not believe it is related to her pulse. She stated she 'simply tripped and fell'. Notes she broke her ankle and hit her head. States she had a CT scan which showed no abnormalities.

## 2016-12-13 NOTE — Telephone Encounter (Signed)
I recommend that the patient see her PCP in follow up to her potential irregular heartbeat.  When she was seen at the hospital, she had a normal cardiac exam by auscultation by the ER physician.  I would hold off on the cardiology referral at this time.   Cc- Billie Ruddy

## 2016-12-13 NOTE — Telephone Encounter (Signed)
Left message to call Haruna Rohlfs at [phone]. 

## 2016-12-14 NOTE — Telephone Encounter (Signed)
Left message to call [name] at [phone]. 

## 2016-12-14 NOTE — Telephone Encounter (Signed)
Spoke with patient. Advised of message as seen below from Dr.Silva. Patient is agreeable and verbalizes understanding. States that she has an appointment with her PCP for a physical in May. Will keep this appointment for follow up.  Cc: Pricilla Loveless  Routing to provider for final review. Patient agreeable to disposition. Will close encounter.

## 2016-12-14 NOTE — Telephone Encounter (Signed)
Patient returning call.

## 2017-02-20 ENCOUNTER — Telehealth: Payer: Self-pay | Admitting: Nurse Practitioner

## 2017-02-20 NOTE — Telephone Encounter (Signed)
Call to patient to follow up regarding Cardiology referral. Initial referral placed in April and patient declined at that time due to other medical issues current at the time.   States she did follow through with her recommended referral and saw Dr. Jacinto HalimGanji last week. Notes they performed an EKG and "found a little something". She is scheduled this week for an Echocardiogram and a 'treadmill test'.   Patient notes appreciation for the follow up and will contact us with any needs.  Routing to provider for review. Will close encounter.

## 2017-03-27 ENCOUNTER — Telehealth: Payer: Self-pay | Admitting: Obstetrics and Gynecology

## 2017-03-27 NOTE — Telephone Encounter (Signed)
Left message for patient to call back and reschedule with another provider Shirlyn Goltz([name] appt)

## 2017-04-13 ENCOUNTER — Encounter: Payer: Self-pay | Admitting: Internal Medicine

## 2017-04-13 ENCOUNTER — Encounter (INDEPENDENT_AMBULATORY_CARE_PROVIDER_SITE_OTHER): Payer: Self-pay

## 2017-04-13 ENCOUNTER — Ambulatory Visit (INDEPENDENT_AMBULATORY_CARE_PROVIDER_SITE_OTHER): Payer: Medicare Other | Admitting: Internal Medicine

## 2017-04-13 VITALS — BP 100/64 | HR 77 | Ht 61.0 in | Wt 142.0 lb

## 2017-04-13 DIAGNOSIS — R55 Syncope and collapse: Secondary | ICD-10-CM

## 2017-04-13 NOTE — Patient Instructions (Addendum)
Medication Instructions:  Your physician recommends that you continue on your current medications as directed. Please refer to the Current Medication list given to you today.  Labwork: None ordered.  Testing/Procedures: None ordered.  Follow-Up:  Dr. Ladona Ridgelaylor recommends you have an implantable loop recorder.  This will monitor your heart and identify any arrhthymias.    We have May 12, 2017 available for the procedure.  Follow up with Dr. Ladona Ridgelaylor as needed.  Let us know if you want to have the loop implanted.  Any Other Special Instructions Will Be Listed Below (If Applicable).  If you decide you would like to go ahead with the procedure call me, Ancil BoozerJenny Charvez [place]hies RN @ [phone].  If you need a refill on your cardiac medications before your next appointment, please call your pharmacy.

## 2017-04-13 NOTE — Progress Notes (Signed)
HPI [name] is referred today by Dr. Jacinto HalimGanji for evaluation of syncope. The patient is not convinced she ever passed out but she does admit to a period of altered consciousness about 6 months ago where she went to the ground and does not think that she tripped. She has undergone extensive workup by Dr. Jacinto HalimGanji which is negative. She does not have bundle branch block with her 12 lead and does have only occaisional PVC's. Exercise stress test was low risk. She has had a recent diagnosis of an "irregular heart beat". No anginal symptoms. She admits to a 6 lb weight gain. Allergies  Allergen Reactions  . Codeine Other (See Comments)    Unknown reaction  . Sulfa Antibiotics Rash    Childhood reaction     Current Outpatient Prescriptions  Medication Sig Dispense Refill  . B Complex-C (B-COMPLEX WITH VITAMIN C) tablet Take 1 tablet by mouth daily.    . fish oil-omega-3 fatty acids 1000 MG capsule Take 1 g by mouth daily.     . fluticasone (FLONASE) 50 MCG/ACT nasal spray Place 2 sprays into both nostrils 2 (two) times daily.     Marland Kitchen. glucosamine-chondroitin 500-400 MG tablet Take 1 tablet by mouth 3 (three) times daily.    . indapamide (LOZOL) 1.25 MG tablet Take 1.25 mg by mouth 2 (two) times a week. On Saturday and Wednesday    . levothyroxine (SYNTHROID, LEVOTHROID) 88 MCG tablet Take 88 mcg by mouth daily.      Marland Kitchen. loratadine (CLARITIN) 10 MG tablet Take 10 mg by mouth daily.    . Nutritional Supplements (DHEA PO) Take 1 capsule by mouth every morning.    Marland Kitchen. omeprazole (PRILOSEC) 20 MG capsule Take 20 mg by mouth 2 (two) times daily.   3  . oxyCODONE-acetaminophen (PERCOCET) 5-325 MG tablet Take 2 tablets by mouth every 4 (four) hours as needed. 20 tablet 0  . sertraline (ZOLOFT) 50 MG tablet Take 50 mg by mouth daily.    . TURMERIC PO Take 1 capsule by mouth every morning.    . Vitamin D, Ergocalciferol, (DRISDOL) 50000 UNITS CAPS Take 50,000 Units by mouth every 14 (fourteen) days.      No  current facility-administered medications for this visit.      Past Medical History:  Diagnosis Date  . Depression   . Glaucoma   . Glaucoma   . Hypothyroid 20 yrs ago  . Vitamin D deficiency disease 3/08    ROS:   All systems reviewed and negative except as noted in the HPI.   Past Surgical History:  Procedure Laterality Date  . GLAUCOMA SURGERY    . REFRACTIVE SURGERY Bilateral 12/2006  . thumb repair       Family History  Problem Relation Age of Onset  . Stroke Mother        CVA  . Mitral valve prolapse Mother   . AAA (abdominal aortic aneurysm) Father   . Mitral valve prolapse Sister   . Atrial fibrillation Sister   . Breast cancer Unknown [id number]       neice     Social History   Social History  . Marital status: Married    Spouse name: N/A  . Number of children: N/A  . Years of education: N/A   Occupational History  . Not on file.   Social History Main Topics  . Smoking status: Passive Smoke Exposure - Never Smoker  . Smokeless tobacco: Never Used  .  Alcohol use No     Comment: rare  . Drug use: No  . Sexual activity: No   Other Topics Concern  . Not on file   Social History Narrative  . No narrative on file     BP 100/64   Pulse 77   Ht 5\' 1"  (1.549 m)   Wt 142 lb (64.4 kg)   LMP 09/12/2000   SpO2 97%   BMI 26.83 kg/m   Physical Exam:  Well appearing NAD HEENT: Unremarkable Neck:  No JVD, no thyromegally Lymphatics:  No adenopathy Back:  No CVA tenderness Lungs:  Clear HEART:  Regular rate rhythm, no murmurs, no rubs, no clicks Abd:  soft, positive bowel sounds, no organomegally, no rebound, no guarding Ext:  2 plus pulses, no edema, no cyanosis, no clubbing Skin:  No rashes no nodules Neuro:  CN II through XII intact, motor grossly intact  EKG - NSR with PVC's  Assess/Plan: 1. Syncope - the etiology is unclear. I have discussed the risks/benefits/goals/expectations of insertion of an ILR and she is willing to proceed. She  has preserved LV function and a low risk stress test. 2. PVC's - unclear if there is any relation to her syncopal episode. She undergo watchful waiting. If she has VT we will be able to see on her heart monitor.  Leonia ReevesGregg Kermitt Harjo,M.D.

## 2017-05-08 ENCOUNTER — Telehealth: Payer: Self-pay | Admitting: Internal Medicine

## 2017-05-08 ENCOUNTER — Ambulatory Visit: Payer: Medicare Other | Admitting: Nurse Practitioner

## 2017-05-08 ENCOUNTER — Telehealth: Payer: Self-pay | Admitting: Neurology

## 2017-05-08 NOTE — Telephone Encounter (Signed)
She is not a pt in our office.  Was returning my call in regarding her husband

## 2017-05-08 NOTE — Telephone Encounter (Signed)
Patient lmom that she was returning your call. I had no idea she was not your patient. thanks

## 2017-05-08 NOTE — Telephone Encounter (Signed)
Thy was returning your call. Thanks

## 2017-05-08 NOTE — Telephone Encounter (Signed)
New message   Pt is calling and stating that she is supposed to get a loop recorder and needs more information on it. She requests a call back from Dr. Bruna Potter nurse.

## 2017-05-08 NOTE — Telephone Encounter (Signed)
Call returned to Pt.  Pt trying to decide if she would like loop recorder or not.  After all Pt questions answered Pt has decided to undergo watchful waiting.  She states she will call office if she has any recurrent dizziness or presyncope.

## 2018-01-15 ENCOUNTER — Telehealth: Payer: Self-pay | Admitting: Obstetrics and Gynecology

## 2018-01-15 NOTE — Telephone Encounter (Signed)
Spoke with patient. Advised last pap was done 04/28/2015 and was normal. Due for a pap smear at this years aex in August. Patient would like to schedule. Aex scheduled for 05/03/2018 at 12:30 pm with Dr.Silva. Patient is agreeable to date and time.  Routing to provider for final review. Patient agreeable to disposition. Will close encounter.

## 2018-01-15 NOTE — Telephone Encounter (Signed)
Patient called requesting to speak with the nurse about her last pap smear results and when she is due for her next pap.

## 2018-03-01 ENCOUNTER — Telehealth: Payer: Self-pay | Admitting: *Deleted

## 2018-03-01 NOTE — Telephone Encounter (Signed)
Call placed to patient, advised of [facility] BMD results dated 02/21/18 per Dr. Edward JollySilva. Patient states she has not been contacted by Dr. Eloise HarmanPaterson regarding results.Offered patient OV to consult regarding BMD, patient declined. Patient will f/u with Dr. Silvano RuskPaterson's office first, [facility] Patient verbalizes understanding. Will await return call.

## 2018-03-01 NOTE — Telephone Encounter (Signed)
Spoke with patient. Patient request to see Dr. Edward JollySilva for BMD consult. Patient asking if consult could wait until AEX 05/03/18? Recommended earlier OV to discuss results. OV scheduled for 03/20/18 at 9am with Dr. Edward JollySilva. Patient declined earlier OV, will be out of town.   BMD results to scan.   Routing to provider for final review. Patient is agreeable to disposition. Will close encounter.

## 2018-03-01 NOTE — Telephone Encounter (Signed)
Patient returning call to Jill. °

## 2018-03-19 ENCOUNTER — Institutional Professional Consult (permissible substitution): Payer: Medicare Other | Admitting: Neurology

## 2018-03-20 ENCOUNTER — Other Ambulatory Visit: Payer: Self-pay

## 2018-03-20 ENCOUNTER — Encounter: Payer: Self-pay | Admitting: Obstetrics and Gynecology

## 2018-03-20 ENCOUNTER — Ambulatory Visit: Payer: Medicare Other | Admitting: Obstetrics and Gynecology

## 2018-03-20 VITALS — BP 114/66 | HR 76 | Resp 14 | Ht 61.0 in | Wt 143.0 lb

## 2018-03-20 DIAGNOSIS — M810 Age-related osteoporosis without current pathological fracture: Secondary | ICD-10-CM

## 2018-03-20 MED ORDER — RISEDRONATE SODIUM 150 MG PO TABS: 150.0000 mg | ORAL_TABLET | ORAL | 0 refills | Status: DC

## 2018-03-20 NOTE — Patient Instructions (Signed)
Risedronate tablets What is this medicine? RISEDRONATE (ris ED roe nate) reduces calcium loss from bones. It helps make healthy bone and to slow bone loss in patients with Paget's disease and osteoporosis. It may be used in others at risk for bone loss. This medicine may be used for other purposes; ask your health care provider or pharmacist if you have questions. COMMON BRAND NAME(S): Actonel What should I tell my health care provider before I take this medicine? They need to know if you have any of these conditions: -dental disease -esophagus, stomach, or intestine problems, like acid reflux or GERD -kidney disease -low blood calcium -problems sitting or standing for 30 minutes -trouble swallowing -an unusual or allergic reaction to risedronate, other medicines, foods, dyes, or preservatives -pregnant or trying to get pregnant -breast-feeding How should I use this medicine? You must take this medication exactly as directed or you will lower the amount of medicine you absorb into your body or you may cause your self harm. Take this medicine by mouth first thing in the morning, after you are up for the day. Do not eat or drink anything before you take this medicine. Swallow the tablets with a full glass (6 to 8 fluid ounces) of plain water. Do not take the tablets with any other drink. Do not chew or crush the tablet. After taking this medicine, do not eat breakfast, drink, or take any other medicines or vitamins for at least 30 minutes. Stand or sit up for at least 30 minutes after you take this medicine; do not lie down. Do not take your medicine more often than directed. Talk to your pediatrician regarding the use of this medicine in children. Special care may be needed. Overdosage: If you think you have taken too much of this medicine contact a poison control center or emergency room at once. NOTE: This medicine is only for you. Do not share this medicine with others. What if I miss a  dose? If you miss a dose, do not take it later in the day. Take your normal dose the next morning. Do not take double or extra doses. What may interact with this medicine? -antacids like aluminum hydroxide or magnesium hydroxide -aspirin -calcium supplements -iron supplements -NSAIDs, medicines for pain and inflammation, like ibuprofen or naproxen -thyroid hormones -vitamins with minerals This list may not describe all possible interactions. Give your health care provider a list of all the medicines, herbs, non-prescription drugs, or dietary supplements you use. Also tell them if you smoke, drink alcohol, or use illegal drugs. Some items may interact with your medicine. What should I watch for while using this medicine? Visit your doctor or health care professional for regular check ups. It may be some time before you see the benefit from this medicine. Your doctor or health care professional may order blood tests and other tests to see how you are doing. You should make sure you get enough calcium and vitamin D while you are taking this medicine, unless your doctor tells you not to. Discuss the foods you eat and the vitamins you take with your health care professional. Some people who take this medicine have severe bone, joint, and/or muscle pain. This medicine may also increase your risk for a broken thigh bone. Tell your doctor right away if you have pain in your upper leg or groin. Tell your doctor if you have any pain that does not go away or that gets worse. What side effects may I notice from receiving   this medicine? Side effects that you should report to your doctor or health care professional as soon as possible: -allergic reactions such as skin rash or itching, hives, swelling of the face, lips, throat, or tongue -black or tarry stools -changes in vision -heartburn or stomach pain -jaw pain, especially after dental work -pain or difficulty when swallowing -redness, blistering,  peeling, or loosening of the skin, including inside the mouth Side effects that usually do not require medical attention (report to your doctor or health care professional if they continue or are bothersome): -bone, muscle, or joint pain -changes in taste -diarrhea or constipation -eye pain or itching -headache -nausea or vomiting -stomach gas or fullness This list may not describe all possible side effects. Call your doctor for medical advice about side effects. You may report side effects to FDA at 1-800-FDA-1088. Where should I keep my medicine? Keep out of the reach of children. Store at room temperature between 20 and 25 degrees C (68 and 77 degrees F). Throw away any unused medicine after the expiration date. NOTE: This sheet is a summary. It may not cover all possible information. If you have questions about this medicine, talk to your doctor, pharmacist, or health care provider.  2018 Elsevier/Gold Standard (2015-10-01 09:18:09) Osteoporosis Osteoporosis is the thinning and loss of density in the bones. Osteoporosis makes the bones more brittle, fragile, and likely to break (fracture). Over time, osteoporosis can cause the bones to become so weak that they fracture after a simple fall. The bones most likely to fracture are the bones in the hip, wrist, and spine. What are the causes? The exact cause is not known. What increases the risk? Anyone can develop osteoporosis. You may be at greater risk if you have a family history of the condition or have poor nutrition. You may also have a higher risk if you are:  Female.  50 years old or older.  A smoker.  Not physically active.  White or Asian.  Slender.  What are the signs or symptoms? A fracture might be the first sign of the disease, especially if it results from a fall or injury that would not usually cause a bone to break. Other signs and symptoms include:  Low back and neck pain.  Stooped posture.  Height  loss.  How is this diagnosed? To make a diagnosis, your health care provider may:  Take a medical history.  Perform a physical exam.  Order tests, such as: ? A bone mineral density test. ? A dual-energy X-ray absorptiometry test.  How is this treated? The goal of osteoporosis treatment is to strengthen your bones to reduce your risk of a fracture. Treatment may involve:  Making lifestyle changes, such as: ? Eating a diet rich in calcium. ? Doing weight-bearing and muscle-strengthening exercises. ? Stopping tobacco use. ? Limiting alcohol intake.  Taking medicine to slow the process of bone loss or to increase bone density.  Monitoring your levels of calcium and vitamin D.  Follow these instructions at home:  Include calcium and vitamin D in your diet. Calcium is important for bone health, and vitamin D helps the body absorb calcium.  Perform weight-bearing and muscle-strengthening exercises as directed by your health care provider.  Do not use any tobacco products, including cigarettes, chewing tobacco, and electronic cigarettes. If you need help quitting, ask your health care provider.  Limit your alcohol intake.  Take medicines only as directed by your health care provider.  Keep all follow-up visits as directed   by your health care provider. This is important.  Take precautions at home to lower your risk of falling, such as: ? Keeping rooms well lit and clutter free. ? Installing safety rails on stairs. ? Using rubber mats in the bathroom and other areas that are often wet or slippery. Get help right away if: You fall or injure yourself. This information is not intended to replace advice given to you by your health care provider. Make sure you discuss any questions you have with your health care provider. Document Released: 06/08/2005 Document Revised: 02/01/2016 Document Reviewed: 02/06/2014 Elsevier Interactive Patient Education  2018 Elsevier Inc.  

## 2018-03-20 NOTE — Progress Notes (Signed)
GYNECOLOGY  VISIT   HPI: 77 y.o.   Married  Caucasian  female   G2P2002 with Patient's last menstrual period was 09/12/2000.   here for   BMD consult  BMD from [facility] 02/21/18 showing osteoporosis.  T score of left radius -2.9, right femur -2.0, left femur -1.9, and spine -1.1.  Spine and hips are stable.  FRAX model - 13% risk of major fracture and 3.1 5 risk of hip fracture.   Labs with PCP.  TSH 0.20.  Adjusting her thyroid medication.  Ca 9.0 Vit D 49.1.  No hx steroid use, colitis, weight loss surgery, smoking, ETOH use (occasional beer).   Does weight bearing exercise.  Takes vit D supplementation.  Calcium once per day, MVI and one serving per day.   Has reflux and was dx after she had eval for chronic cough.   Broke her left ankle Feb. 2018.  Stumbled on asphalt.   PCP - Dr. Jarold Motto.  GYNECOLOGIC HISTORY: Patient's last menstrual period was 09/12/2000. Contraception:  Post menopausal  Menopausal hormone therapy:   none Last mammogram:  02-21-18 BIRADS 2 benign   Last pap smear:   04-28-15 negative         OB History    Gravida  2   Para  2   Term  2   Preterm  0   AB  0   Living  2     SAB  0   TAB  0   Ectopic  0   Multiple  0   Live Births  2              There are no active problems to display for this patient.   Past Medical History:  Diagnosis Date  . Depression   . Glaucoma   . Glaucoma   . Hypothyroid 20 yrs ago  . Vitamin D deficiency disease 3/08    Past Surgical History:  Procedure Laterality Date  . GLAUCOMA SURGERY    . REFRACTIVE SURGERY Bilateral 12/2006  . thumb repair      Current Outpatient Medications  Medication Sig Dispense Refill  . CALCIUM PO Take by mouth.    . fish oil-omega-3 fatty acids 1000 MG capsule Take 1 g by mouth daily.     . fluticasone (FLONASE) 50 MCG/ACT nasal spray Place 2 sprays into both nostrils 2 (two) times daily.     Marland Kitchen glucosamine-chondroitin 500-400 MG tablet Take 1 tablet by  mouth 3 (three) times daily.    . indapamide (LOZOL) 1.25 MG tablet Take 1.25 mg by mouth 2 (two) times a week. On Saturday and Wednesday    . levothyroxine (SYNTHROID, LEVOTHROID) 88 MCG tablet Take 88 mcg by mouth daily.      Marland Kitchen loratadine (CLARITIN) 10 MG tablet Take 10 mg by mouth daily.    . Multiple Vitamins-Minerals (MULTIVITAMIN PO) Take by mouth.    . Nutritional Supplements (DHEA PO) Take 1 capsule by mouth every morning.    Marland Kitchen omeprazole (PRILOSEC) 20 MG capsule Take 20 mg by mouth 2 (two) times daily.   3  . sertraline (ZOLOFT) 50 MG tablet Take 50 mg by mouth daily.    . TURMERIC PO Take 1 capsule by mouth every morning.    . Vitamin D, Ergocalciferol, (DRISDOL) 50000 UNITS CAPS Take 50,000 Units by mouth every 14 (fourteen) days.      No current facility-administered medications for this visit.      ALLERGIES: Codeine and Sulfa antibiotics  Family History  Problem Relation Age of Onset  . Stroke Mother        CVA  . Mitral valve prolapse Mother   . AAA (abdominal aortic aneurysm) Father   . Mitral valve prolapse Sister   . Atrial fibrillation Sister   . Non-Hodgkin's lymphoma Sister   . Breast cancer Unknown [id number]       neice    Social History   Socioeconomic History  . Marital status: Married    Spouse name: Not on file  . Number of children: Not on file  . Years of education: Not on file  . Highest education level: Not on file  Occupational History  . Not on file  Social Needs  . Financial resource strain: Not on file  . Food insecurity:    Worry: Not on file    Inability: Not on file  . Transportation needs:    Medical: Not on file    Non-medical: Not on file  Tobacco Use  . Smoking status: Passive Smoke Exposure - Never Smoker  . Smokeless tobacco: Never Used  Substance and Sexual Activity  . Alcohol use: No    Comment: rare  . Drug use: No  . Sexual activity: Never    Partners: Male    Birth control/protection: Post-menopausal  Lifestyle  .  Physical activity:    Days per week: Not on file    Minutes per session: Not on file  . Stress: Not on file  Relationships  . Social connections:    Talks on phone: Not on file    Gets together: Not on file    Attends religious service: Not on file    Active member of club or organization: Not on file    Attends meetings of clubs or organizations: Not on file    Relationship status: Not on file  . Intimate partner violence:    Fear of current or ex partner: Not on file    Emotionally abused: Not on file    Physically abused: Not on file    Forced sexual activity: Not on file  Other Topics Concern  . Not on file  Social History Narrative  . Not on file    Review of Systems  Constitutional: Negative.   HENT: Positive for sinus pain.   Eyes: Negative.   Respiratory: Negative.   Cardiovascular: Negative.   Gastrointestinal: Negative.   Endocrine: Negative.   Genitourinary: Negative.        Loss of urine with cough and sneeze   Musculoskeletal: Positive for arthralgias.  Skin: Negative.   Allergic/Immunologic: Negative.   Neurological: Positive for headaches.  Hematological: Negative.   Psychiatric/Behavioral: Negative.     PHYSICAL EXAMINATION:    BP 114/66 (BP Location: Right Arm, Patient Position: Sitting, Cuff Size: Normal)   Pulse 76   Resp 14   Ht 5\' 1"  (1.549 m)   Wt 143 lb (64.9 kg)   LMP 09/12/2000   BMI 27.02 kg/m     General appearance: alert, cooperative and appears stated age   Chaperone was present for exam.  ASSESSMENT  Osteoporosis of radius. Increase risk fracture by FRAX.  Personal hx of fracture.  Controlled reflux.   PLAN  Bone density report discussed.  Counseled regarding osteoporosis, risk factors, treatment options (bisphosphonates, Evista, Calcitonin, Prolia, Forteo), and fall risk reduction. Discussed importance of weight bearing exercise and daily calcium 1200 mg daily and vit D.   Will check PTH, calcium, phosphorus, vit D  level. Anticipate  starting Actonel 150 mg monthly.  Instructed in use.  Patient does understand that osteoporosis is a silent disease until fracture occurs and understands that there is significant morbidity associated with osteoporotic fracture.  FU for annual exam and prn.    An After Visit Summary was printed and given to the patient.  __25____ minutes face to face time of which over 50% was spent in counseling.

## 2018-03-21 DIAGNOSIS — M810 Age-related osteoporosis without current pathological fracture: Secondary | ICD-10-CM | POA: Insufficient documentation

## 2018-03-21 LAB — PTH, INTACT AND CALCIUM
Calcium: 9.6 mg/dL (ref 8.7–10.3)
PTH: 38 pg/mL (ref 15–65)

## 2018-03-21 LAB — PHOSPHORUS: PHOSPHORUS: 3.2 mg/dL (ref 2.5–4.5)

## 2018-04-02 ENCOUNTER — Institutional Professional Consult (permissible substitution): Payer: Medicare Other | Admitting: Neurology

## 2018-04-17 ENCOUNTER — Encounter: Payer: Self-pay | Admitting: Obstetrics and Gynecology

## 2018-04-28 IMAGING — CT CT HEAD W/O CM
3 of 8 series · 13 of 47 positions shown, 16 images · non-contrast
Comparison: None.

CLINICAL DATA: 75-year-old female with fall

EXAM:
CT HEAD WITHOUT CONTRAST
CT CERVICAL SPINE WITHOUT CONTRAST
TECHNIQUE: Multidetector CT imaging of the head and cervical spine was
performed following the standard protocol without intravenous
contrast. Multiplanar CT image reconstructions of the cervical spine
were also generated.

[Series 6: sagittal · sagittal · 0.31mm/px · 2 of 74 slices shown]
[im 25/74  brain]
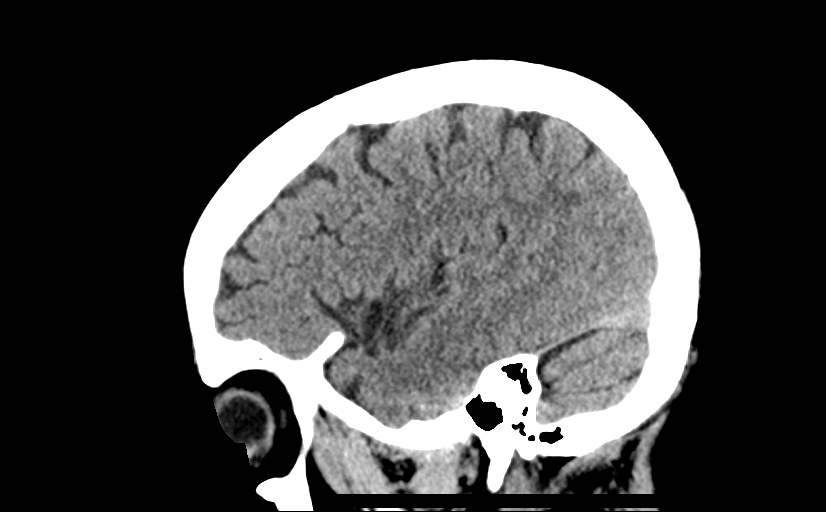
[im 49/74  brain]
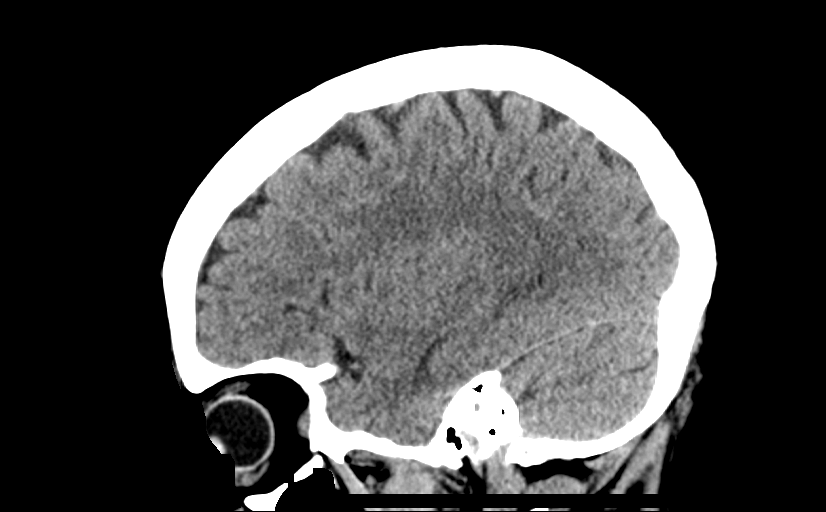

[Series 10: axial recon · axial · 0.27mm/px · z∈[+1042,+1190]mm · 9 of 102 slices shown, 12 images]
[im 10/102  brain]
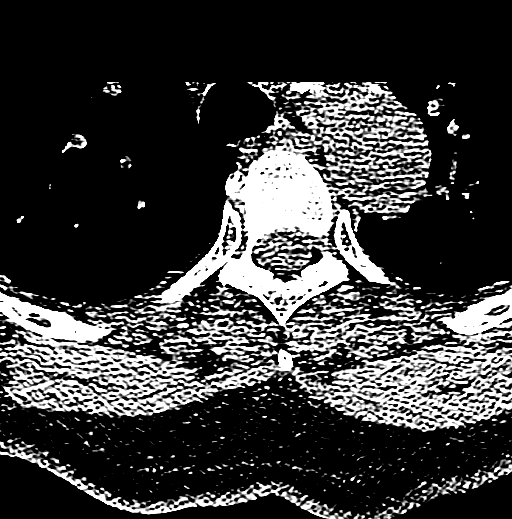
[im 10/102  bone]
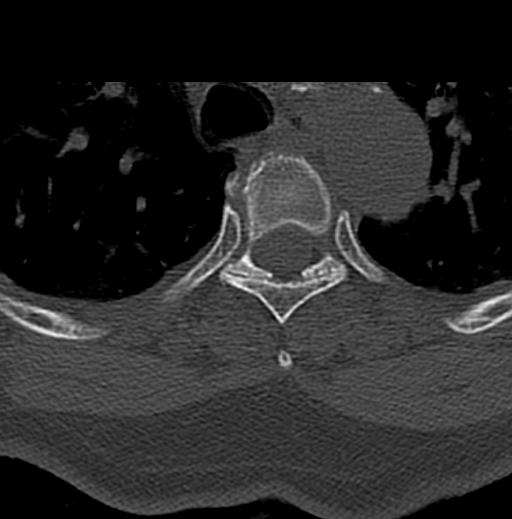
[im 19/102  brain]
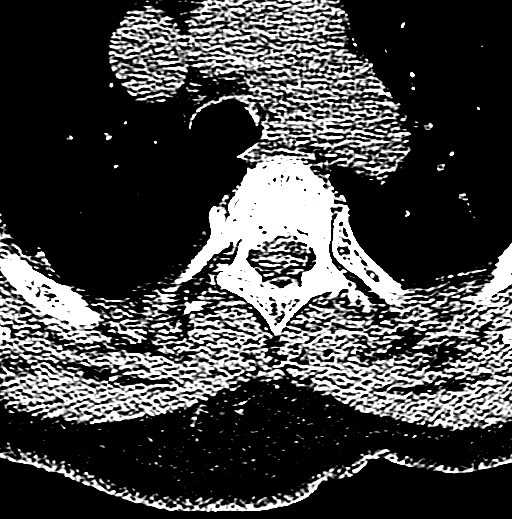
[im 28/102  brain]
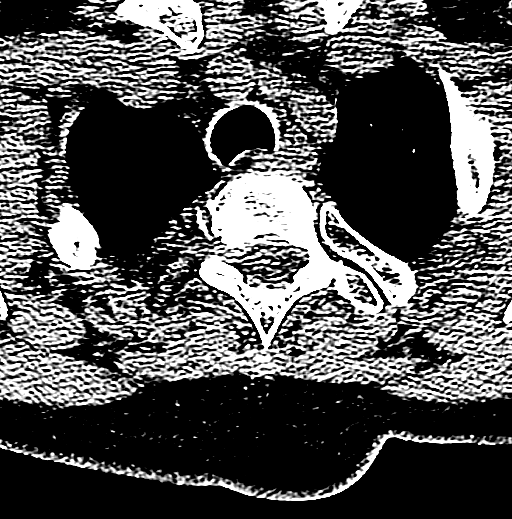
[im 37/102  brain]
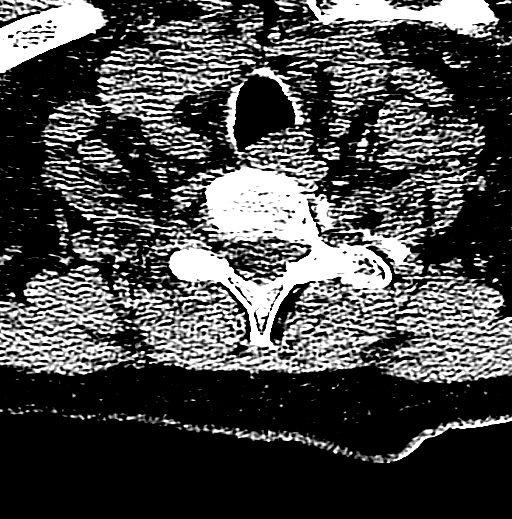
[im 56/102  brain]
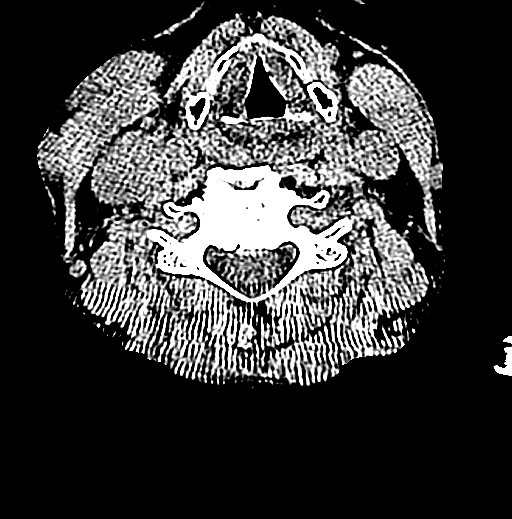
[im 56/102  bone]
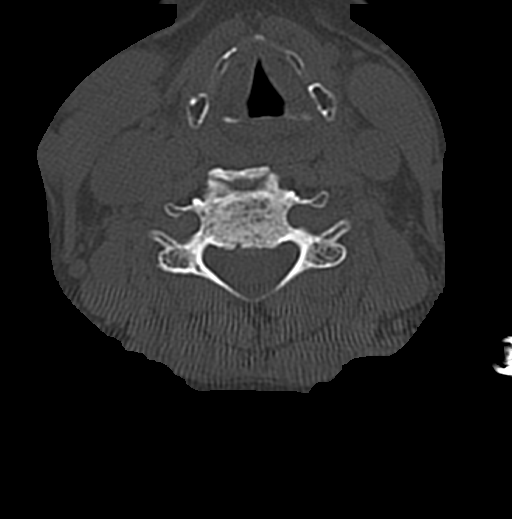
[im 65/102  brain]
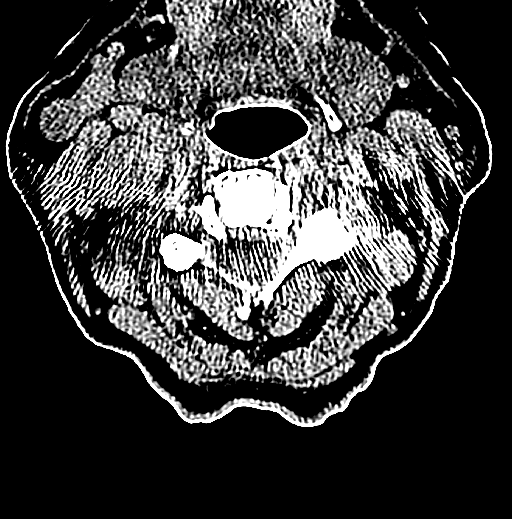
[im 74/102  brain]
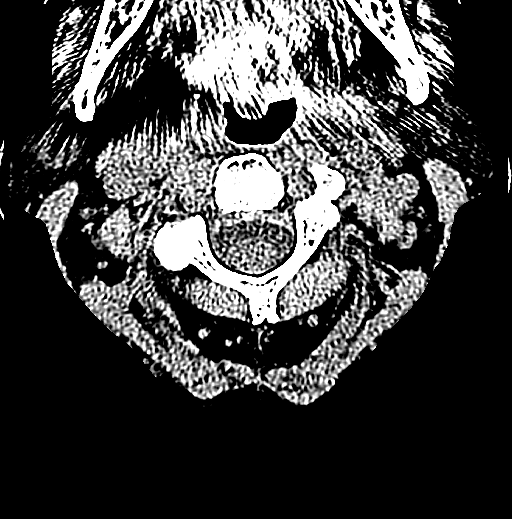
[im 83/102  brain]
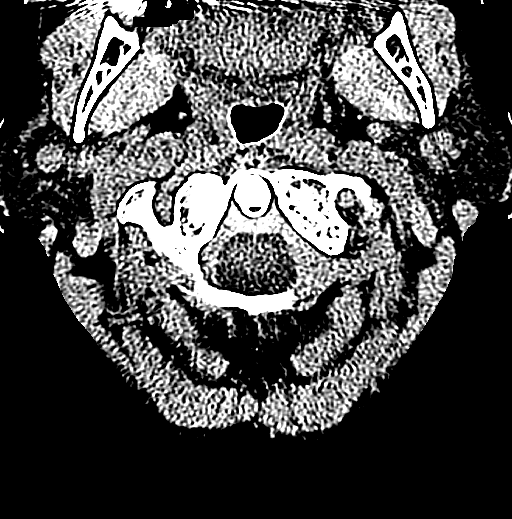
[im 92/102  brain]
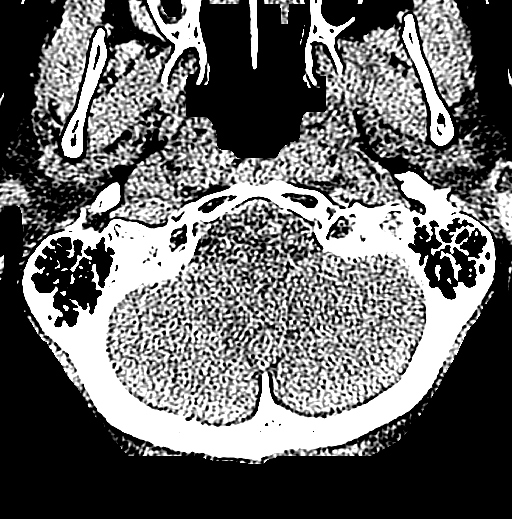
[im 92/102  bone]
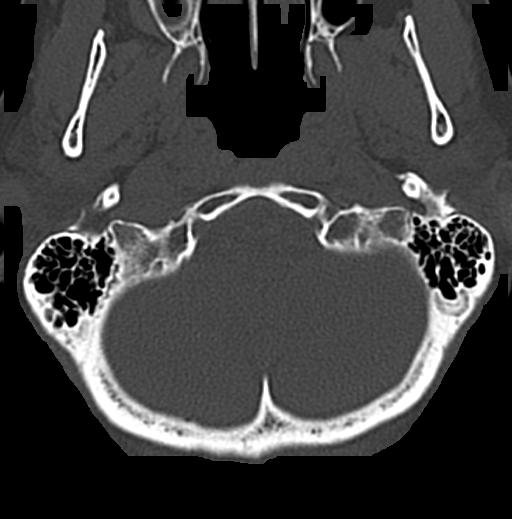

[Series 11: coronal · coronal · 0.28mm/px · 2 of 61 slices shown]
[im 37/61  brain]
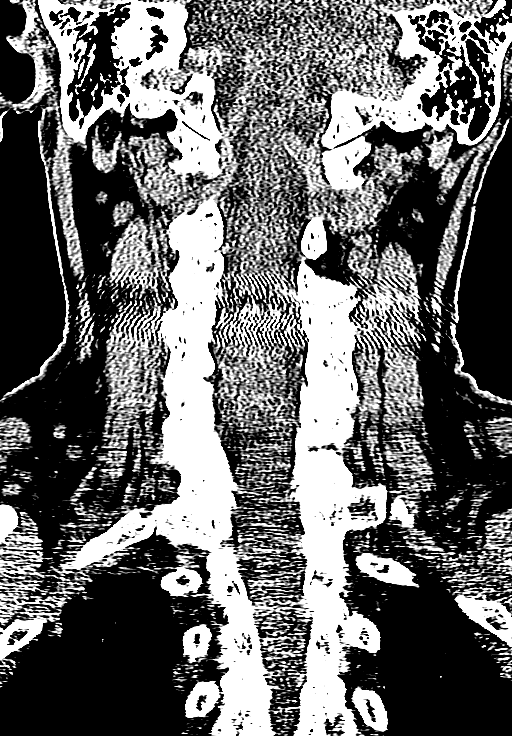
[im 49/61  brain]
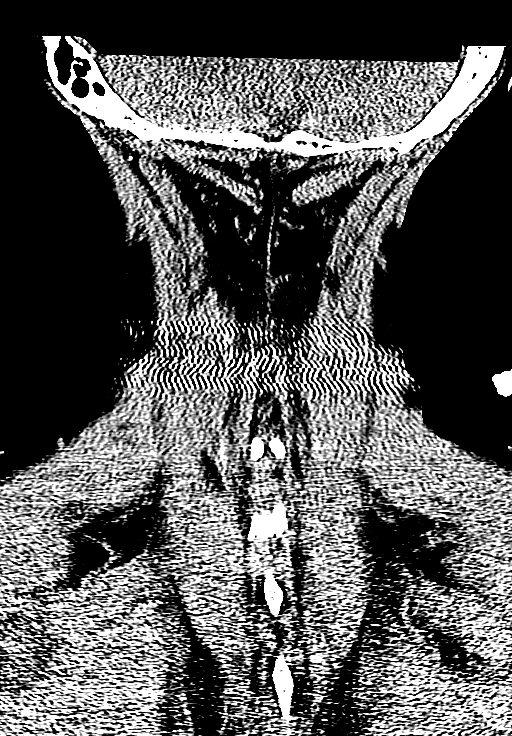

[13 of 47 positions shown; findings below may reference images not displayed]

FINDINGS: CT HEAD FINDINGS

Brain: The ventricles and sulci appropriate size for patient's age.
Minimal periventricular and deep white matter chronic microvascular
ischemic changes noted. There is no acute intracranial hemorrhage.
No mass effect or midline shift noted. No extra-axial fluid
collection.

Vascular: No hyperdense vessel or unexpected calcification.

Skull: Normal. Negative for fracture or focal lesion.

Sinuses/Orbits: There is minimal mucoperiosteal thickening of
paranasal sinuses. No air-fluid levels. The mastoid air cells are
clear.

Other: Minimal contusion over the right temporal area.

CT CERVICAL SPINE FINDINGS

Alignment: No acute subluxation. There is mild reversal of normal
cervical lordosis at C4-C6 secondary to chronic degenerative
changes.

Skull base and vertebrae: No acute fracture. Multilevel degenerative
changes most prominent at C4-C6 with anterior osteophytes. There is
endplate irregularity with associated disc space narrowing at C4-C5
and C5-C6. Endplate irregularity and disc space narrowing also noted
and T2-T3.

Soft tissues and spinal canal: No prevertebral fluid or swelling. No
visible canal hematoma.

Disc levels: Multilevel disc space narrowing. There is facet
hypertrophy with associated mild narrowing of the left neural
foramina at C3-C4.

Upper chest: Negative.

Other: There is irregularity of the upper sternum, likely chronic.
Correlation with clinical exam recommended.
IMPRESSION: 1. No acute intracranial hemorrhage.
2. Mild age-related atrophy and chronic microvascular ischemic
changes.
3. No acute/traumatic cervical spine pathology. Multilevel
degenerative changes of the cervical spine.

## 2018-05-03 ENCOUNTER — Ambulatory Visit: Payer: Medicare Other | Admitting: Obstetrics and Gynecology

## 2018-05-04 ENCOUNTER — Other Ambulatory Visit: Payer: Self-pay

## 2018-05-04 ENCOUNTER — Ambulatory Visit (INDEPENDENT_AMBULATORY_CARE_PROVIDER_SITE_OTHER): Payer: Medicare Other | Admitting: Obstetrics and Gynecology

## 2018-05-04 ENCOUNTER — Other Ambulatory Visit (HOSPITAL_COMMUNITY)
Admission: RE | Admit: 2018-05-04 | Discharge: 2018-05-04 | Disposition: A | Discharge status: Home / Self Care | Payer: Medicare Other | Source: Ambulatory Visit | Attending: Obstetrics and Gynecology | Admitting: Obstetrics and Gynecology

## 2018-05-04 ENCOUNTER — Other Ambulatory Visit (HOSPITAL_COMMUNITY): Payer: Self-pay | Admitting: *Deleted

## 2018-05-04 ENCOUNTER — Encounter: Payer: Self-pay | Admitting: Obstetrics and Gynecology

## 2018-05-04 VITALS — BP 110/60 | HR 68 | Resp 16 | Ht 61.0 in | Wt 143.0 lb

## 2018-05-04 DIAGNOSIS — Z01419 Encounter for gynecological examination (general) (routine) without abnormal findings: Secondary | ICD-10-CM

## 2018-05-04 NOTE — Progress Notes (Signed)
77 y.o. 742P2002 Married Caucasian female here for annual exam.    No vaginal bleeding.  No vaginal discharge.   Contacted her PCP about taking Actonel.  She is going to start Prolia instead through her PCP. Concerned about reflux symptoms.  Vit D 49.1 with PCP.  Normal PTH, calcium, and phosphorus.   She is hoping to enter a study for her GERD.  PCP: Dr. Jarome Matinaniel Paterson    Patient's last menstrual period was 09/12/2000.           Sexually active: No.  The current method of family planning is post menopausal status.    Exercising: Yes.    gym 3x a week Smoker:  no  Health Maintenance: Pap:  04/28/15 Negative History of abnormal Pap:  no MMG:  02/21/18 BIRADS 2 benign/density b Colonoscopy:  01/25/13 normal recheck 10 yrs BMD:   02/21/18  Result  Osteoporosis TDaP:  2013 Gardasil:   n/a HIV and Hep C: never Screening Labs:  PCP   reports that she is a non-smoker but has been exposed to tobacco smoke. She has never used smokeless tobacco. She reports that she does not drink alcohol or use drugs.  Past Medical History:  Diagnosis Date  . Depression   . Glaucoma   . Glaucoma   . Hypothyroid 20 yrs ago  . Vitamin D deficiency disease 3/08    Past Surgical History:  Procedure Laterality Date  . GLAUCOMA SURGERY    . REFRACTIVE SURGERY Bilateral 12/2006  . thumb repair      Current Outpatient Medications  Medication Sig Dispense Refill  . CALCIUM PO Take by mouth.    . fish oil-omega-3 fatty acids 1000 MG capsule Take 1 g by mouth daily.     . fluticasone (FLONASE) 50 MCG/ACT nasal spray Place 2 sprays into both nostrils 2 (two) times daily.     Marland Kitchen. glucosamine-chondroitin 500-400 MG tablet Take 1 tablet by mouth 3 (three) times daily.    . indapamide (LOZOL) 1.25 MG tablet Take 1.25 mg by mouth 2 (two) times a week. On Saturday and Wednesday    . ipratropium (ATROVENT) 0.06 % nasal spray INSTILL 2 SPRAYS IN EACH NOSTRIL TWICE A DAY AS NEEDED FOR DRAINAGE  5  .  levothyroxine (SYNTHROID, LEVOTHROID) 88 MCG tablet Take 88 mcg by mouth daily.      Marland Kitchen. loratadine (CLARITIN) 10 MG tablet Take 10 mg by mouth daily.    . Multiple Vitamins-Minerals (MULTIVITAMIN PO) Take by mouth.    . Nutritional Supplements (DHEA PO) Take 1 capsule by mouth every morning.    Marland Kitchen. omeprazole (PRILOSEC) 20 MG capsule Take 20 mg by mouth 2 (two) times daily.   3  . sertraline (ZOLOFT) 50 MG tablet Take 50 mg by mouth daily.    . TURMERIC PO Take 1 capsule by mouth every morning.    . Vitamin D, Ergocalciferol, (DRISDOL) 50000 UNITS CAPS Take 50,000 Units by mouth every 14 (fourteen) days.      No current facility-administered medications for this visit.     Family History  Problem Relation Age of Onset  . Stroke Mother        CVA  . Mitral valve prolapse Mother   . AAA (abdominal aortic aneurysm) Father   . Mitral valve prolapse Sister   . Atrial fibrillation Sister   . Non-Hodgkin's lymphoma Sister   . Breast cancer Unknown 47       neice    Review of Systems  All other systems reviewed and are negative.   Exam:   BP 110/60 (BP Location: Right Arm, Patient Position: Sitting, Cuff Size: Normal)   Pulse 68   Resp 16   Ht 5\' 1"  (1.549 m)   Wt 143 lb (64.9 kg)   LMP 09/12/2000   BMI 27.02 kg/m     General appearance: alert, cooperative and appears stated age Head: Normocephalic, without obvious abnormality, atraumatic Neck: no adenopathy, supple, symmetrical, trachea midline and thyroid normal to inspection and palpation Lungs: clear to auscultation bilaterally Breasts: normal appearance, no masses or tenderness, No nipple retraction or dimpling, No nipple discharge or bleeding, No axillary or supraclavicular adenopathy Heart: regular rate and rhythm Abdomen: soft, non-tender; no masses, no organomegaly Extremities: extremities normal, atraumatic, no cyanosis or edema Skin: Skin color, texture, turgor normal. No rashes or lesions Lymph nodes: Cervical,  supraclavicular, and axillary nodes normal. No abnormal inguinal nodes palpated Neurologic: Grossly normal  Pelvic: External genitalia:  no lesions              Urethra:  normal appearing urethra with no masses, tenderness or lesions              Bartholins and Skenes: normal                 Vagina: normal appearing vagina with normal color and discharge, no lesions              Cervix: no lesions              Pap taken: Yes.   Bimanual Exam:  Uterus:  normal size, contour, position, consistency, mobility, non-tender              Adnexa: no mass, fullness, tenderness              Rectal exam: Yes.  .  Confirms.              Anus:  normal sphincter tone, no lesions  Chaperone was present for exam.  Assessment:   Well woman visit with normal exam. Osteoporosis of radius.  Increased rx risk by FRAX.  Wills tart Prolia through her PCP.  Hx low vit D.  Personal hx of fracture.  Reflux.  Plan: Mammogram screening. Recommended self breast awareness. Pap and HR HPV as above. Guidelines for Calcium, Vitamin D, regular exercise program including cardiovascular and weight bearing exercise. BMD in 2 years.  Follow up annually and prn.   After visit summary provided.

## 2018-05-04 NOTE — Patient Instructions (Signed)

## 2018-05-07 ENCOUNTER — Ambulatory Visit (HOSPITAL_COMMUNITY)
Admission: RE | Admit: 2018-05-07 | Discharge: 2018-05-07 | Disposition: A | Discharge status: Home / Self Care | Payer: Medicare Other | Source: Ambulatory Visit | Attending: Internal Medicine | Admitting: Internal Medicine

## 2018-05-07 DIAGNOSIS — M810 Age-related osteoporosis without current pathological fracture: Secondary | ICD-10-CM | POA: Diagnosis present

## 2018-05-07 LAB — CYTOLOGY - PAP: Diagnosis: NEGATIVE

## 2018-05-07 MED ORDER — DENOSUMAB 60 MG/ML ~~LOC~~ SOSY
PREFILLED_SYRINGE | SUBCUTANEOUS | Status: AC
  Administered 2018-05-07: 60 mg via SUBCUTANEOUS
  Filled 2018-05-07: qty 1

## 2018-05-07 MED ORDER — DENOSUMAB 60 MG/ML ~~LOC~~ SOSY
60.0000 mg | PREFILLED_SYRINGE | Freq: Once | SUBCUTANEOUS | Status: AC
  Administered 2018-05-07: 60 mg via SUBCUTANEOUS

## 2018-05-07 NOTE — Discharge Instructions (Signed)
Denosumab injection °What is this medicine? °DENOSUMAB (den oh sue mab) slows bone breakdown. Prolia is used to treat osteoporosis in women after menopause and in men. Xgeva is used to treat a high calcium level due to cancer and to prevent bone fractures and other bone problems caused by multiple myeloma or cancer bone metastases. Xgeva is also used to treat giant cell tumor of the bone. °This medicine may be used for other purposes; ask your health care provider or pharmacist if you have questions. °COMMON BRAND NAME(S): Prolia, XGEVA °What should I tell my health care provider before I take this medicine? °They need to know if you have any of these conditions: °-dental disease °-having surgery or tooth extraction °-infection °-kidney disease °-low levels of calcium or Vitamin D in the blood °-malnutrition °-on hemodialysis °-skin conditions or sensitivity °-thyroid or parathyroid disease °-an unusual reaction to denosumab, other medicines, foods, dyes, or preservatives °-pregnant or trying to get pregnant °-breast-feeding °How should I use this medicine? °This medicine is for injection under the skin. It is given by a health care professional in a hospital or clinic setting. °If you are getting Prolia, a special MedGuide will be given to you by the pharmacist with each prescription and refill. Be sure to read this information carefully each time. °For Prolia, talk to your pediatrician regarding the use of this medicine in children. Special care may be needed. For Xgeva, talk to your pediatrician regarding the use of this medicine in children. While this drug may be prescribed for children as young as 13 years for selected conditions, precautions do apply. °Overdosage: If you think you have taken too much of this medicine contact a poison control center or emergency room at once. °NOTE: This medicine is only for you. Do not share this medicine with others. °What if I miss a dose? °It is important not to miss your  dose. Call your doctor or health care professional if you are unable to keep an appointment. °What may interact with this medicine? °Do not take this medicine with any of the following medications: °-other medicines containing denosumab °This medicine may also interact with the following medications: °-medicines that lower your chance of fighting infection °-steroid medicines like prednisone or cortisone °This list may not describe all possible interactions. Give your health care provider a list of all the medicines, herbs, non-prescription drugs, or dietary supplements you use. Also tell them if you smoke, drink alcohol, or use illegal drugs. Some items may interact with your medicine. °What should I watch for while using this medicine? °Visit your doctor or health care professional for regular checks on your progress. Your doctor or health care professional may order blood tests and other tests to see how you are doing. °Call your doctor or health care professional for advice if you get a fever, chills or sore throat, or other symptoms of a cold or flu. Do not treat yourself. This drug may decrease your body's ability to fight infection. Try to avoid being around people who are sick. °You should make sure you get enough calcium and vitamin D while you are taking this medicine, unless your doctor tells you not to. Discuss the foods you eat and the vitamins you take with your health care professional. °See your dentist regularly. Brush and floss your teeth as directed. Before you have any dental work done, tell your dentist you are receiving this medicine. °Do not become pregnant while taking this medicine or for 5 months after stopping   it. Talk with your doctor or health care professional about your birth control options while taking this medicine. Women should inform their doctor if they wish to become pregnant or think they might be pregnant. There is a potential for serious side effects to an unborn child. Talk  to your health care professional or pharmacist for more information. What side effects may I notice from receiving this medicine? Side effects that you should report to your doctor or health care professional as soon as possible: -allergic reactions like skin rash, itching or hives, swelling of the face, lips, or tongue -bone pain -breathing problems -dizziness -jaw pain, especially after dental work -redness, blistering, peeling of the skin -signs and symptoms of infection like fever or chills; cough; sore throat; pain or trouble passing urine -signs of low calcium like fast heartbeat, muscle cramps or muscle pain; pain, tingling, numbness in the hands or feet; seizures -unusual bleeding or bruising -unusually weak or tired Side effects that usually do not require medical attention (report to your doctor or health care professional if they continue or are bothersome): -constipation -diarrhea -headache -joint pain -loss of appetite -muscle pain -runny nose -tiredness -upset stomach This list may not describe all possible side effects. Call your doctor for medical advice about side effects. You may report side effects to FDA at 1-800-FDA-1088. Where should I keep my medicine? This medicine is only given in a clinic, doctor's office, or other health care setting and will not be stored at home. NOTE: This sheet is a summary. It may not cover all possible information. If you have questions about this medicine, talk to your doctor, pharmacist, or health care provider.  2018 Elsevier/Gold Standard (2016-09-20 19:17:21)

## 2018-08-02 ENCOUNTER — Encounter: Payer: Self-pay | Admitting: Neurology

## 2018-08-06 ENCOUNTER — Ambulatory Visit: Payer: Medicare Other | Admitting: Neurology

## 2018-08-06 ENCOUNTER — Encounter: Payer: Self-pay | Admitting: Neurology

## 2018-08-06 VITALS — BP 100/67 | HR 70 | Ht 61.0 in | Wt 144.0 lb

## 2018-08-06 DIAGNOSIS — R0683 Snoring: Secondary | ICD-10-CM

## 2018-08-06 DIAGNOSIS — R931 Abnormal findings on diagnostic imaging of heart and coronary circulation: Secondary | ICD-10-CM | POA: Diagnosis not present

## 2018-08-06 DIAGNOSIS — I272 Pulmonary hypertension, unspecified: Secondary | ICD-10-CM

## 2018-08-06 DIAGNOSIS — I38 Endocarditis, valve unspecified: Secondary | ICD-10-CM | POA: Diagnosis not present

## 2018-08-06 NOTE — Progress Notes (Signed)
SLEEP MEDICINE CLINIC   Provider:  Melvyn Novasarmen  Grabiel Schmutz, MD   Primary Care Physician:  Jarome MatinPaterson, Daniel, MD   Referring Provider: Jarome MatinPaterson, Daniel, MD    Chief Complaint  Patient presents with  . New Patient (Initial Visit)    pt with husband, rm 10. pt snores in sleep. she had been evaluated by ENT and there was no concern. pt states she sleeps good and gets avg of 8 hrs a night. pt states used to get tired during the day -after  she started taking magnesium she feels some better.    HPI:  Andrea Henderson is a 77 y.o. female patient seen here on 08-06-2018 in a referral from Dr. Eloise HarmanPaterson for a sleep consultation.   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  Andrea Henderson reports that she feels she gets good quality sleep, she regularly gets sleep but she has an increasing level of fatigue.  She does not report an irresistible urge to go to sleep, sleep attacks, waking up choking or having shortness of breath, but her husband reports that she is snoring. She endorsed a fatigue severity score at 26/ 63 points, the Epworth sleepiness score at 8/ 24/  She exercises regularly, and when active has not fatigue.    Chief complaint according to patient : None   Sleep habits are as follows: Dinnertime varies greatly:as early as 5 PM as late as 8:30 PM, and mostly between 6 and 8 PM.  She would be able to go to bed and sleep as early as 10 PM but she usually stays up and goes to bed with her husband at 11 PM. Neither one has trouble initiating sleep, the bedroom is cool, quiet and dark.  She prefers to sleep on her side she is using a firm flat pillow for neck support.  The bed is not adjustable.  She carries a diagnosis of gastroesophageal reflux disease-acid reflux but it has not woken her up nor been an impediment to sleep. She usually sleeps through until her first bathroom wake at 4 AM this is usually the only one.  She can then go back to sleep 3 or 4 hours.  Rise time in the  morning is about 8 AM. [name] used to be a more restless and active sleeper and often has moved towards the recliner to sleep there.  He also has COPD.  Sleep medical history: The patient saw Dr. 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  She did not opt for surgery.  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  Colon polyps, osteopenia, left knee osteoarthritis,  Cardiology she is followed by Dr. Ladona Ridgelaylor and by Dr. Jacinto HalimGanji.   She has moderate mitral regurgitation tricuspid regurgitation patient with pulmonary hypertension her PA systolic pressure was mentioned at 31.  She has bilateral atrial enlargement related to the valvular disease, left ventricular EF 50%. A 10-2016 fall with ankle fracture- let to cardio work up.  PVC- Dr Ladona Ridgelaylor- patient declined loop.   Social history: Mrs. Andrea Henderson social history she is a retired Runner, broadcasting/film/videoteacher, she also is very involved in her 75 year old grandson's life.  She is still tutoring.  She used to work at [employer].  No tobacco use no alcohol use.  Caffeine: caffeine free tea and soda, drinks coffee 50% decaff 1-2 cups a day   Review of Systems: Out of a complete 14 system review, the patient complains of only the following symptoms, and all other reviewed systems are negative.  snoring , PVCs, dry mouth, no headaches, some times light headed.   Epworth Sleepiness Score: 8-10/ 24  , Fatigue severity score 26/ 63 points   , depression score n/a   Social History   Socioeconomic History  . Marital status: Married    Spouse name: Not on file  . Number of children: Not on file  . Years of education: Not on file  . Highest  education level: Not on file  Occupational History  . Not on file  Social Needs  . Financial resource strain: Not on file  . Food insecurity:    Worry: Not on file    Inability: Not on file  . Transportation needs:    Medical: Not on file    Non-medical: Not on file  Tobacco Use  . Smoking status: Passive Smoke Exposure - Never Smoker  . Smokeless tobacco: Never Used  Substance and Sexual Activity  . Alcohol use: No    Comment: rare  . Drug use: No  . Sexual activity: Not Currently    Partners: Male    Birth control/protection: Post-menopausal  Lifestyle  . Physical activity:    Days per week: 5 days a week gym and Yoga.     Minutes per session: Not on file  . Stress: Not on file  Relationships  .                                 Marland Kitchen                       Other Topics Concern  . Not on file  Social History Narrative  . Not on file    Family History  Problem Relation Age of Onset  . Stroke Mother        CVA  . Mitral valve prolapse Mother   . AAA (abdominal aortic aneurysm) Father   . Mitral valve prolapse Sister   . Atrial fibrillation Sister   . Non-Hodgkin's lymphoma Sister   . Breast cancer Unknown 47       neice    Past Medical History:  Diagnosis Date  . Depression   . Glaucoma   . Glaucoma   . HTN (hypertension)   . Hypothyroid 20 yrs ago  . Vitamin D deficiency disease 3/08    Past Surgical History:  Procedure Laterality Date  . GLAUCOMA SURGERY    . REFRACTIVE SURGERY Bilateral 12/2006  . thumb repair      Current Outpatient Medications  Medication Sig Dispense Refill  . CALCIUM PO Take by mouth.    . fish oil-omega-3 fatty acids 1000 MG capsule Take 1 g by mouth daily.     . fluticasone (FLONASE) 50 MCG/ACT nasal spray Place 2 sprays into both nostrils 2 (two) times daily.     Marland Kitchen glucosamine-chondroitin 500-400 MG tablet Take 1 tablet by mouth 3 (three) times daily.    . indapamide (LOZOL) 1.25 MG tablet Take 1.25 mg by mouth 2  (two) times a week. On Saturday and Wednesday    . ipratropium (ATROVENT) 0.06 %  nasal spray INSTILL 2 SPRAYS IN EACH NOSTRIL TWICE A DAY AS NEEDED FOR DRAINAGE  5  . levothyroxine (SYNTHROID, LEVOTHROID) 88 MCG tablet Take 88 mcg by mouth daily.      Marland Kitchen loratadine (CLARITIN) 10 MG tablet Take 10 mg by mouth daily.    . Magnesium 400 MG TABS Take 1 tablet by mouth daily.    . Multiple Vitamins-Minerals (MULTIVITAMIN PO) Take by mouth.    . Nutritional Supplements (DHEA PO) Take 1 capsule by mouth every morning.    Marland Kitchen omeprazole (PRILOSEC) 20 MG capsule Take 20 mg by mouth 2 (two) times daily.   3  . sertraline (ZOLOFT) 50 MG tablet Take 50 mg by mouth daily.    . TURMERIC PO Take 1 capsule by mouth every morning.    . Vitamin D, Ergocalciferol, (DRISDOL) 50000 UNITS CAPS Take 50,000 Units by mouth every 14 (fourteen) days.      No current facility-administered medications for this visit.     Allergies as of 08/06/2018 - Review Complete 08/06/2018  Allergen Reaction Noted  . Codeine Other (See Comments) 09/04/2011  . Sulfa antibiotics Rash 09/04/2011    Vitals: BP 100/67   Pulse 70   Ht 5\' 1"  (1.549 m)   Wt 144 lb (65.3 kg)   LMP 09/12/2000   BMI 27.21 kg/m  Last Weight:  Wt Readings from Last 1 Encounters:  08/06/18 144 lb (65.3 kg)   ZOX:WRUE mass index is 27.21 kg/m.     Last Height:   Ht Readings from Last 1 Encounters:  08/06/18 5\' 1"  (1.549 m)    Physical exam:  General: The patient is awake, alert and appears not in acute distress. The patient is well groomed. Head: Normocephalic, atraumatic. Neck is supple. Mallampati 3-4. neck circumference:13. 5 " . Nasal airflow patent. Cardiovascular: Regular rate and rhythm with every 6th beat skipping, with regurgitation murmur. No carotid bruit, and no distended neck veins. Respiratory: Lungs are clear to auscultation.  Skin:  Without evidence of edema, or rash Trunk: BMI is 27.2. The patient's posture is erect.  Neurologic  exam : The patient is awake and alert, oriented to place and time.   Attention span & concentration ability appears normal.  Speech is fluent,  without dysarthria, dysphonia or aphasia.  Mood and affect are appropriate.  Cranial nerves:  taste and smell intact. Pupils are equal and briskly reactive to light. Extraocular movements  in vertical and horizontal planes intact and without nystagmus. Visual fields by finger perimetry are intact.Hearing to finger rub intact.  Facial sensation intact to fine touch. Facial motor strength is symmetric and tongue and uvula move midline. Shoulder shrug was symmetrical.  Motor exam: Normal tone, muscle bulk and symmetric strength in all extremities. Sensory:  Fine touch, pinprick and vibration were tested in all extremities. Proprioception tested in the upper extremities was normal. Coordination: Rapid alternating movements in the fingers/hands was normal. Finger-to-nose maneuver  normal without evidence of ataxia, dysmetria or tremor. Gait and station: Patient walks without assistive device and is able unassisted to climb up to the exam table. Strength within normal limits. Stance is stable and normal. Tandem gait is unfragmented. Turns with  3 Steps. Deep tendon reflexes: in the upper and lower extremities are symmetric and intact.   Assessment:  After physical and neurologic examination, review of laboratory studies,  Personal review of imaging studies, reports of other /same  Imaging studies, results of polysomnography and / or neurophysiology testing and pre-existing records as  far as provided in visit., my assessment is:   1) Andrea Henderson has a higher risk of obstructive sleep apnea in spite of her normal body mass index, slender neck.  Her risk factors   Include a mild retrognathia but she does have a narrowed upper airway.  2) her husband's observation is that she snores,  seems to have more progressed since she is sleeping on her right  side to avoid neck  pain.  3) additional risk factors are GERD which could be relating to the present hoarseness, mild dysphonia, in addition pulmonary hypertension to a mild degree . she is on Indepamide 1.25 mg daily.   She has been treated for osteopenia to prevent osteoporosis by taking vitamin D, she has seasonal allergic rhinitis.  She is not taking a whole lot of prescription drugs but rather more supplements,  over-the-counter medications. Zoloft and xanax are listed.    The patient was advised of the nature of the diagnosed disorder , the treatment options and the  risks for general health and wellness arising from not treating the condition.   I spent more than 45  minutes of face to face time with the patient.  Greater than 50% of time was spent in counseling and coordination of care. We have discussed the diagnosis and differential and I answered the patient's questions.    Plan:  Treatment plan and additional workup :  Attended sleep study , SPLIT at AHI 40, 4 % - at  risk for central and obstructive apnea.    Melvyn Novas, MD 08/06/2018, 1:12 PM  Certified in Neurology by ABPN Certified in Sleep Medicine by [facility] [address], [address] [place], [place] [address]

## 2018-09-18 ENCOUNTER — Ambulatory Visit (INDEPENDENT_AMBULATORY_CARE_PROVIDER_SITE_OTHER): Payer: Medicare Other | Admitting: Neurology

## 2018-09-18 DIAGNOSIS — I272 Pulmonary hypertension, unspecified: Secondary | ICD-10-CM

## 2018-09-18 DIAGNOSIS — I499 Cardiac arrhythmia, unspecified: Secondary | ICD-10-CM

## 2018-09-18 DIAGNOSIS — R0683 Snoring: Secondary | ICD-10-CM

## 2018-09-18 DIAGNOSIS — G4731 Primary central sleep apnea: Secondary | ICD-10-CM | POA: Diagnosis not present

## 2018-09-18 DIAGNOSIS — I38 Endocarditis, valve unspecified: Secondary | ICD-10-CM

## 2018-09-18 DIAGNOSIS — R931 Abnormal findings on diagnostic imaging of heart and coronary circulation: Secondary | ICD-10-CM

## 2018-09-28 DIAGNOSIS — R931 Abnormal findings on diagnostic imaging of heart and coronary circulation: Secondary | ICD-10-CM | POA: Insufficient documentation

## 2018-09-28 DIAGNOSIS — I341 Nonrheumatic mitral (valve) prolapse: Secondary | ICD-10-CM | POA: Insufficient documentation

## 2018-09-28 DIAGNOSIS — I493 Ventricular premature depolarization: Secondary | ICD-10-CM | POA: Insufficient documentation

## 2018-09-28 DIAGNOSIS — I499 Cardiac arrhythmia, unspecified: Secondary | ICD-10-CM | POA: Insufficient documentation

## 2018-09-28 DIAGNOSIS — I272 Pulmonary hypertension, unspecified: Secondary | ICD-10-CM | POA: Insufficient documentation

## 2018-09-28 DIAGNOSIS — R0683 Snoring: Secondary | ICD-10-CM | POA: Insufficient documentation

## 2018-09-28 DIAGNOSIS — I38 Endocarditis, valve unspecified: Secondary | ICD-10-CM | POA: Insufficient documentation

## 2018-09-28 DIAGNOSIS — G4731 Primary central sleep apnea: Secondary | ICD-10-CM | POA: Insufficient documentation

## 2018-09-28 NOTE — Procedures (Signed)
PATIENT'S NAME:  Andrea, Henderson DOB:      1941-01-21      MR#:    [id number]     DATE OF RECORDING: 09/18/2018  [facility] REFERRING M.D.:  Jarome Matin, M.D, Dr. Jacinto Halim  Study Performed:   Baseline Polysomnogram HISTORY:  Andrea Henderson is a 78 y.o. female patient seen on 08-06-2018 in a referral from Dr. Eloise Harman for a sleep consultation. 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  Andrea Henderson reports that she feels she gets good quality sleep, she regularly gets sleep but she has an increasing level of fatigue.  She does not report an irresistible urge to go to sleep, sleep attacks, waking up choking or having shortness of breath, but her husband reports that she is snoring.  She endorsed a fatigue severity score at 26/ 63 points, the Epworth sleepiness score at 10/ 24 points  She exercises regularly, and when active has not fatigue.  Chief complaint according to patient: None ; She carries a diagnosis of gastroesophageal reflux disease-acid reflux but it has not woken her up nor been an impediment to sleep. She usually sleeps through until her first bathroom wake at 4 AM this is usually the only one.  She can then go back to sleep another 3 or 4 hours, She has moved towards the recliner to sleep there, also has COPD. 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  Colon polyps, osteopenia, left knee osteoarthritis,  Cardiology she is followed by Dr. Ladona Ridgel and by Dr. Jacinto Halim. She has moderate mitral regurgitation tricuspid regurgitation patient with pulmonary hypertension her PA systolic pressure was mentioned at 31.  She has bilateral atrial  enlargement related to the valvular disease, left ventricular EF 50%. A 10-2016 fall with ankle fracture- let to cardio work up.  PVC- Dr Ladona Ridgel- patient declined loop.  The patient endorsed the Epworth Sleepiness Scale at 10/24 points.   The patient's weight 144 pounds with a height of 61 (inches), resulting in a BMI of 27.1 kg/m2. The patient's neck circumference measured 13.5 inches.  CURRENT MEDICATIONS: Flonase, Lozol, Atrovent, Synthroid, Claritin, Magnesium, Multivitamin, Prilosec, Zoloft, Turmeric, Drisdol.   PROCEDURE:  This is a multichannel digital polysomnogram utilizing the Somnostar 11.2 system.  Electrodes and sensors were applied and monitored per AASM Specifications.   EEG, EOG, Chin and Limb EMG, were sampled at 200 Hz.  ECG, Snore and Nasal Pressure, Thermal Airflow, Respiratory Effort, CPAP Flow and Pressure, Oximetry was sampled at 50 Hz. Digital video and audio were recorded.      BASELINE STUDY  Lights Out was at 21:57 and Lights On at 05:01.  Total recording time (TRT) was 425 minutes, with a total sleep time (TST) of 372.5 minutes.   The patient's sleep latency was 50 minutes.  REM latency was 149.5 minutes.  The sleep efficiency was 87.6 %.     SLEEP ARCHITECTURE: WASO (Wake after sleep onset) was 49.5 minutes.  There were 7 minutes in Stage N1, 240.5 minutes Stage N2, 84.5 minutes Stage N3 and 40.5 minutes in Stage REM.  The percentage of Stage N1 was 1.9%, Stage N2 was 64.6%, Stage  N3 was 22.7% and Stage R (REM sleep) was 10.9%.   RESPIRATORY ANALYSIS:  There were a total of 51 respiratory events:  1 obstructive apneas, 3 central apneas and 0 mixed apneas with a total of 4 apneas and an apnea index (AI) of 0.6 /hour. There were 47 hypopneas with a hypopnea index of 7.6 /hour without (RERAs).The total APNEA/HYPOPNEA INDEX (AHI) was 8.2 /hour.   16 events occurred in REM sleep and 65 events in NREM. The REM AHI was 23.7 /hour, versus a non-REM AHI of 6.3. The patient spent  118.5 minutes of total sleep time in the supine position and 254 minutes in non-supine. The supine AHI was 7.1 versus a non-supine AHI of 8.7.  OXYGEN SATURATION & C02:  The Wake baseline 02 saturation was 92%, with the lowest being 78%. Time spent below 89% saturation equaled 106 minutes.   AROUSALS/ PERIODIC LIMB MOVEMENTS:  The arousals were noted as: 130 were spontaneous, 0 were associated with PLMs, 8 were associated with respiratory events. The patient had a total of 0 Periodic Limb Movements.   Audio and video analysis did not show any abnormal or unusual movements, behaviors, phonations or vocalizations.   EKG was highly irregular. See screen shots.  Post-study, the patient indicated that sleep was the same as usual.   IMPRESSION: 1. REM accentuated, COMPLEX Sleep Apnea, independent of position. 2. Prolonged hypoxemia and cardiac irregular beats.  3. Snoring 4. Non-specific abnormal EKG  RECOMMENDATIONS: Advise full-night, attended, CPAP titration study to optimize therapy. This complex apnea with hypoxemia should not be treated by auto CPAP and can change to central dominant apnea under PAP therapy. This patient may need oxygen supplementation.    I certify that I have reviewed the entire raw data recording prior to the issuance of this report in accordance with the Standards of Accreditation of the American Academy of Sleep Medicine (AASM)     Melvyn Novas, MD  09-28-2018 Diplomat, American Board of Psychiatry and Neurology  Diplomat, American Board of Sleep Medicine Medical Director, [facility] [employer]

## 2018-09-28 NOTE — Addendum Note (Signed)
Addended by: Melvyn Novas on: 09/28/2018 05:10 PM   Modules accepted: Orders

## 2018-09-30 ENCOUNTER — Telehealth: Payer: Self-pay | Admitting: Neurology

## 2018-09-30 NOTE — Telephone Encounter (Signed)
Please share the results of her recent sleep study: here is a report: PATIENT'S NAME:  Andrea Henderson, Andrea Henderson DOB:      03/16/1941      MR#:    [id number]     DATE OF RECORDING: 09/18/2018  [facility] REFERRING M.D.:  Jarome Matin, M.D, Dr. Jacinto Halim  Study Performed:   Baseline Polysomnogram HISTORY:  Andrea Henderson is a 78 y.o. female patient seen on 08-06-2018 in a referral from Dr. Eloise Harman for a sleep consultation. 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  Andrea Henderson reports that she feels she gets good quality sleep, she regularly gets sleep but she has an increasing level of fatigue.  She does not report an irresistible urge to go to sleep, sleep attacks, waking up choking or having shortness of breath, but her husband reports that she is snoring.  She endorsed a fatigue severity score at 26/ 63 points, the Epworth sleepiness score at 10/ 24 points  She exercises regularly, and when active has not fatigue.  Chief complaint according to patient: None ; She carries a diagnosis of gastroesophageal reflux disease-acid reflux but it has not woken her up nor been an impediment to sleep. She usually sleeps through until her first bathroom wake at 4 AM this is usually the only one.  She can then go back to sleep another 3 or 4 hours, She has moved towards the recliner to sleep there, also has COPD. 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  Colon polyps, osteopenia, left knee osteoarthritis,  Cardiology she is followed by Dr. Ladona Ridgel and by Dr. Jacinto Halim. She has moderate mitral regurgitation tricuspid regurgitation patient with pulmonary hypertension her  PA systolic pressure was mentioned at 31.  She has bilateral atrial enlargement related to the valvular disease, left ventricular EF 50%. A 10-2016 fall with ankle fracture- let to cardio work up.  PVC- Dr Ladona Ridgel- patient declined loop.  The patient endorsed the Epworth Sleepiness Scale at 10/24 points.   The patient's weight 144 pounds with a height of 61 (inches), resulting in a BMI of 27.1 kg/m2. The patient's neck circumference measured 13.5 inches.  CURRENT MEDICATIONS: Flonase, Lozol, Atrovent, Synthroid, Claritin, Magnesium, Multivitamin, Prilosec, Zoloft, Turmeric, Drisdol.   PROCEDURE:  This is a multichannel digital polysomnogram utilizing the Somnostar 11.2 system.  Electrodes and sensors were applied and monitored per AASM Specifications.   EEG, EOG, Chin and Limb EMG, were sampled at 200 Hz.  ECG, Snore and Nasal Pressure, Thermal Airflow, Respiratory Effort, CPAP Flow and Pressure, Oximetry was sampled at 50 Hz. Digital video and audio were recorded.      BASELINE STUDY  Lights Out was at 21:57 and Lights On at 05:01.  Total recording time (TRT) was 425 minutes, with a total sleep time (TST) of 372.5 minutes.   The patient's sleep latency was 50 minutes.  REM latency was 149.5 minutes.  The sleep efficiency was 87.6 %.     SLEEP ARCHITECTURE: WASO (Wake after sleep onset) was 49.5 minutes.  There were 7 minutes in Stage N1, 240.5 minutes Stage N2, 84.5 minutes Stage N3 and 40.5 minutes in Stage REM.  The percentage of Stage N1 was 1.9%, Stage N2 was 64.6%, Stage N3 was 22.7% and Stage R (REM sleep) was 10.9%.   RESPIRATORY ANALYSIS:  There were a total of 51 respiratory events:  1 obstructive apneas, 3 central apneas and 0 mixed apneas with a total of 4 apneas and an apnea index (AI) of 0.6 /hour. There were 47 hypopneas with a hypopnea index of 7.6 /hour without (RERAs).The total APNEA/HYPOPNEA INDEX (AHI) was 8.2 /hour.   16 events occurred in REM sleep and 65 events in NREM. The REM  AHI was 23.7 /hour, versus a non-REM AHI of 6.3. The patient spent 118.5 minutes of total sleep time in the supine position and 254 minutes in non-supine. The supine AHI was 7.1 versus a non-supine AHI of 8.7.  OXYGEN SATURATION & C02:  The Wake baseline 02 saturation was 92%, with the lowest being 78%. Time spent below 89% saturation equaled 106 minutes.   AROUSALS/ PERIODIC LIMB MOVEMENTS:  The arousals were noted as: 130 were spontaneous, 0 were associated with PLMs, 8 were associated with respiratory events. The patient had a total of 0 Periodic Limb Movements.   Audio and video analysis did not show any abnormal or unusual movements, behaviors, phonations or vocalizations.   EKG was highly irregular. See screen shots.    IMPRESSION: 1. REM accentuated, COMPLEX Sleep Apnea, independent of position. Snoring. 2. Prolonged hypoxemia. 3. Cardiac: irregularity beats. Abnormal EKG, screen shots attached.   RECOMMENDATIONS: Advise full-night, attended, CPAP titration study to optimize therapy. This complex apnea with hypoxemia should not be treated by auto CPAP and can change to central dominant apnea under PAP therapy. This patient may need oxygen supplementation.    I certify that I have reviewed the entire raw data recording prior to the issuance of this report in accordance with the Standards of Accreditation of the American Academy of Sleep Medicine (AASM)     Melvyn Novasarmen Idelia Caudell, MD    09-28-2018 Diplomat, American Board of Psychiatry and Neurology  Diplomat, American Board of Sleep Medicine Medical Director, [facility]

## 2018-10-01 NOTE — Telephone Encounter (Signed)

## 2018-11-01 ENCOUNTER — Telehealth: Payer: Self-pay

## 2018-11-01 NOTE — Telephone Encounter (Signed)
Pt is hesitant to schedule CPAP study at this time. Pt would like to see MD to discuss results of sleep study.

## 2018-11-01 NOTE — Telephone Encounter (Signed)
Well as of this moment I don't have an opening to offer for the patient to get her in soon to talk. I will offer if an opening comes available next week.

## 2018-11-06 NOTE — Telephone Encounter (Signed)
Called the patient and offered an apt tomorrow at 10:30 am with a 10 am check in to come in and discuss sleep study results.

## 2018-11-06 NOTE — Telephone Encounter (Signed)
Called the patient because an afternoon apt opened up. I was able to push her apt to help her out. Pt was appreciative.

## 2018-11-07 ENCOUNTER — Ambulatory Visit: Payer: Medicare Other | Admitting: Neurology

## 2018-11-07 ENCOUNTER — Encounter: Payer: Self-pay | Admitting: Neurology

## 2018-11-07 ENCOUNTER — Ambulatory Visit: Payer: Self-pay | Admitting: Neurology

## 2018-11-07 VITALS — BP 100/67 | HR 80 | Ht 61.0 in | Wt 146.0 lb

## 2018-11-07 DIAGNOSIS — I272 Pulmonary hypertension, unspecified: Secondary | ICD-10-CM | POA: Diagnosis not present

## 2018-11-07 DIAGNOSIS — G4734 Idiopathic sleep related nonobstructive alveolar hypoventilation: Secondary | ICD-10-CM

## 2018-11-07 DIAGNOSIS — R931 Abnormal findings on diagnostic imaging of heart and coronary circulation: Secondary | ICD-10-CM

## 2018-11-07 DIAGNOSIS — R0683 Snoring: Secondary | ICD-10-CM

## 2018-11-07 DIAGNOSIS — J111 Influenza due to unidentified influenza virus with other respiratory manifestations: Secondary | ICD-10-CM

## 2018-11-07 DIAGNOSIS — R490 Dysphonia: Secondary | ICD-10-CM

## 2018-11-07 NOTE — Progress Notes (Signed)
SLEEP MEDICINE CLINIC   Provider:  Melvyn Novas, MD   Primary Care Physician:  Jarome Matin, MD   Referring Provider: Jarome Matin, MD    Chief Complaint  Patient presents with  . New Patient (Initial Visit)    pt with husband, rm 10. pt snores in sleep. she had been evaluated by ENT and there was no concern. pt states she sleeps good and gets avg of 8 hrs a night. pt states used to get tired during the day -after  she started taking magnesium she feels some better.    Interval history : 11-07-2018, patient of Dr. Verl Dicker and Dr. Silvano Rusk. The patient does not have COPD she told me and sleeps no in a recliner at home.  We discussed the PSG result of hypoxemia, mild , but complex sleep apnea (09-18-2018). She snores. She was surprised about her long sleep latency while in the lab, and the 106 minutes of hypoxemia. She is again chronically coughing and reports her hoarseness, coughing and irritated larynx are due to GERD, not apnea. She ordered an adjustable bed to treat the GERD and help her husband to breathe, he has CoPD , he goes to the recliner at night.  Treatment of mild apnea is optional. She will give the bed a month and see if she sleeps and breathes better.     HPI:  Andrea Henderson is a 78 y.o. female patient seen here on 08-06-2018 in a referral from Dr. Eloise Harman for a sleep consultation.   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  Andrea Henderson reports that she feels she gets good quality sleep, she regularly gets sleep but she has an increasing level of fatigue.  She does not report an irresistible urge to go to sleep, sleep attacks, waking up choking or having shortness of breath, but her husband reports that she is snoring. She endorsed a fatigue severity score at 26/ 63 points, the Epworth sleepiness score at 8/ 24/  She exercises regularly, and when active has not fatigue.    Chief complaint according to patient : None   Sleep habits are  as follows: Dinnertime varies greatly:as early as 5 PM as late as 8:30 PM, and mostly between 6 and 8 PM.  She would be able to go to bed and sleep as early as 10 PM but she usually stays up and goes to bed with her husband at 11 PM. Neither one has trouble initiating sleep, the bedroom is cool, quiet and dark.  She prefers to sleep on her side she is using a firm flat pillow for neck support.  The bed is not adjustable.  She carries a diagnosis of gastroesophageal reflux disease-acid reflux but it has not woken her up nor been an impediment to sleep. She usually sleeps through until her first bathroom wake at 4 AM this is usually the only one.  She can then go back to sleep 3 or 4 hours.  Rise time in the morning is about 8 AM. Ms. Henderson used to be a more restless and active sleeper and often has moved towards the recliner to sleep there.  He also has COPD.  Sleep medical history: The patient saw Dr. 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  She did not opt for surgery.  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  Colon polyps, osteopenia, left knee osteoarthritis,  Cardiology she is followed by Dr. Ladona Ridgel and by Dr. Jacinto Halim.   She has moderate mitral regurgitation tricuspid regurgitation patient with pulmonary hypertension her PA systolic pressure was mentioned at 31.  She has bilateral atrial enlargement related to the valvular disease, left ventricular EF 50%. A 10-2016 fall with ankle fracture- let to cardio work up.  PVC- Dr Ladona Ridgel- patient declined loop.   Social history: Andrea Henderson social history she is a retired Runner, broadcasting/film/video, she also is very involved in her  32 year old grandson's life.  She is still tutoring.  She used to work at [employer].  No tobacco use no alcohol use.  Caffeine: caffeine free tea and soda, drinks coffee 50% decaff 1-2 cups a day   Review of Systems: Out of a complete 14 system review, the patient complains of only the following symptoms, and all other reviewed systems are negative.  snoring , PVCs, dry mouth, no headaches, some times light headed.   Epworth Sleepiness Score: 8-10/ 24  , Fatigue severity score 26/ 63 points   , depression score n/a   Social History   Socioeconomic History  . Marital status: Married    Spouse name: Not on file  . Number of children: Not on file  . Years of education: Not on file  . Highest education level: Not on file  Occupational History  . Not on file  Social Needs  . Financial resource strain: Not on file  . Food insecurity:    Worry: Not on file    Inability: Not on file  . Transportation needs:    Medical: Not on file    Non-medical: Not on file  Tobacco Use  . Smoking status: Passive Smoke Exposure - Never Smoker  . Smokeless tobacco: Never Used  Substance and Sexual Activity  . Alcohol use: No    Comment: rare  . Drug use: No  . Sexual activity: Not Currently    Partners: Male    Birth control/protection: Post-menopausal  Lifestyle  . Physical activity:    Days per week: 5 days a week gym and Yoga.     Minutes per session: Not on file  . Stress: Not on file  Relationships  .                                 Marland Kitchen                       Other Topics Concern  . Not on file  Social History Narrative  . Not on file    Family History  Problem Relation Age of Onset  . Stroke Mother        CVA  . Mitral valve prolapse Mother   . AAA (abdominal aortic aneurysm) Father   . Mitral valve prolapse Sister   . Atrial fibrillation Sister   . Non-Hodgkin's lymphoma Sister   . Breast cancer Unknown 47       neice    Past Medical History:  Diagnosis  Date  . Depression   . Glaucoma   . Glaucoma   . HTN (hypertension)   . Hypothyroid 20 yrs ago  . Vitamin D deficiency  disease 3/08    Past Surgical History:  Procedure Laterality Date  . GLAUCOMA SURGERY    . REFRACTIVE SURGERY Bilateral 12/2006  . thumb repair      Current Outpatient Medications  Medication Sig Dispense Refill  . CALCIUM PO Take by mouth.    . fish oil-omega-3 fatty acids 1000 MG capsule Take 1 g by mouth daily.     . fluticasone (FLONASE) 50 MCG/ACT nasal spray Place 2 sprays into both nostrils 2 (two) times daily.     Marland Kitchen. glucosamine-chondroitin 500-400 MG tablet Take 1 tablet by mouth 3 (three) times daily.    . indapamide (LOZOL) 1.25 MG tablet Take 1.25 mg by mouth 2 (two) times a week. On Saturday and Wednesday    . levothyroxine (SYNTHROID, LEVOTHROID) 88 MCG tablet Take 88 mcg by mouth daily.      Marland Kitchen. loratadine (CLARITIN) 10 MG tablet Take 10 mg by mouth daily.    . Magnesium 400 MG TABS Take 1 tablet by mouth daily.    . Multiple Vitamins-Minerals (MULTIVITAMIN PO) Take by mouth.    . Nutritional Supplements (DHEA PO) Take 1 capsule by mouth every morning.    . pantoprazole (PROTONIX) 40 MG tablet Take 40 mg by mouth daily.    . sertraline (ZOLOFT) 50 MG tablet Take 50 mg by mouth daily.    . TURMERIC PO Take 1 capsule by mouth every morning.    . Vitamin D, Ergocalciferol, (DRISDOL) 50000 UNITS CAPS Take 50,000 Units by mouth every 14 (fourteen) days.      No current facility-administered medications for this visit.     Allergies as of 11/07/2018 - Review Complete 11/07/2018  Allergen Reaction Noted  . Codeine Other (See Comments) 09/04/2011  . Sulfa antibiotics Rash 09/04/2011    Vitals: BP 100/67   Pulse 80   Ht 5\' 1"  (1.549 m)   Wt 146 lb (66.2 kg)   LMP 09/12/2000   BMI 27.59 kg/m  Last Weight:  Wt Readings from Last 1 Encounters:  11/07/18 146 lb (66.2 kg)   ZOX:WRUEBMI:Body mass index is 27.59 kg/m.     Last Height:   Ht Readings from Last  1 Encounters:  11/07/18 5\' 1"  (1.549 m)    Physical exam:  General: The patient is awake, alert and appears not in acute distress. The patient is well groomed. Head: Normocephalic, atraumatic. Neck is supple. Mallampati 3-4. neck circumference:13. 5 " . Nasal airflow patent. Cardiovascular: Regular rate and rhythm with every 6 th beat skipping.No carotid bruit, and no distended neck veins. Respiratory: Lungs are clear to auscultation.  Skin:  Without evidence of edema, or rash Trunk: BMI is 27.2. The patient's posture is erect.  Neurologic exam :The patient is awake and alert, oriented to place and time.  Attention span & concentration ability appears normal.  Speech is fluent,  without dysarthria, dysphonia or aphasia.  Mood and affect are appropriate.  Cranial nerves: taste and smell intact. Pupils are equal and briskly reactive to light.  Facial sensation intact to fine touch.  Facial motor strength is symmetric and tongue and uvula move midline. Shoulder shrug was symmetrical.  Motor exam: Normal tone, muscle bulk and symmetric strength in all extremities. Sensory:  Deferred.    The patient was advised of the nature of the diagnosed disorder , the treatment options and the  risks for general health and wellness arising from not treating the condition.   I spent more than 15  minutes of face to  face time with the patient.  Greater than 50% of time was spent in counseling and coordination of care. We have discussed the diagnosis and differential and I answered the patient's questions.    Plan:  Treatment plan and additional workup :  Patient will call us back in 3-4 weeks and tell us if she is coming back for an attended CPAP titration due to hypoxemia, mild complex apnea, in the meantime she will try to control the sinusitis, untreated GERD and cough.    Melvyn Novas, MD 11/07/2018, 3:49 PM  Certified in Neurology by ABPN Certified in Sleep Medicine by [facility]  [facility] [address] [place], [place] [address]

## 2018-12-31 ENCOUNTER — Telehealth: Payer: Self-pay

## 2018-12-31 NOTE — Telephone Encounter (Signed)
Ill call the patient and make sure she wants to completed auto CPAP first.

## 2018-12-31 NOTE — Telephone Encounter (Signed)
Pt was scheduled for a CPAP study for 12/31/2018. Since sleep lab is still closed, would MD want to order AutoPAP for patient?

## 2018-12-31 NOTE — Telephone Encounter (Signed)
Per previous visit this patient saw Dr Vickey Huger 2/26 and was suppose to call back and let us know about moving forward with CPAP titration study. We have not heard from the patient. My assumption is she is not going to go through with it.

## 2019-01-01 NOTE — Telephone Encounter (Signed)
Called the patient and reviewed the option of auto CPAP. At this time the patient states that she has purchased a bed that has a reclining options and her husband has stated she is not snoring since that. She declines setting up with auto CPAP for now. Advised the patient to call back if she had any issues arise. Pt verbalized understanding.

## 2019-02-21 ENCOUNTER — Other Ambulatory Visit (HOSPITAL_COMMUNITY): Payer: Self-pay | Admitting: *Deleted

## 2019-02-22 ENCOUNTER — Ambulatory Visit (HOSPITAL_COMMUNITY)
Admission: RE | Admit: 2019-02-22 | Discharge: 2019-02-22 | Disposition: A | Discharge status: Home / Self Care | Payer: Medicare Other | Source: Ambulatory Visit | Attending: Internal Medicine | Admitting: Internal Medicine

## 2019-02-22 ENCOUNTER — Other Ambulatory Visit: Payer: Self-pay

## 2019-02-22 DIAGNOSIS — M810 Age-related osteoporosis without current pathological fracture: Secondary | ICD-10-CM | POA: Insufficient documentation

## 2019-02-22 MED ORDER — DENOSUMAB 60 MG/ML ~~LOC~~ SOSY
PREFILLED_SYRINGE | SUBCUTANEOUS | Status: AC
  Administered 2019-02-22: 60 mg
  Filled 2019-02-22: qty 1

## 2019-02-22 MED ORDER — DENOSUMAB 60 MG/ML ~~LOC~~ SOSY: 60.0000 mg | PREFILLED_SYRINGE | Freq: Once | SUBCUTANEOUS | Status: DC

## 2019-02-27 ENCOUNTER — Encounter: Payer: Self-pay | Admitting: Obstetrics and Gynecology

## 2019-05-09 ENCOUNTER — Ambulatory Visit: Payer: Medicare Other | Admitting: Obstetrics and Gynecology

## 2019-05-24 ENCOUNTER — Other Ambulatory Visit: Payer: Self-pay

## 2019-05-27 NOTE — Progress Notes (Signed)
78 y.o. 352P2002 Married Caucasian female here for annual exam.    She denies vaginal bleeding.  If she takes a diuretic, she had increased urination and then cannot get to the bathroom if she has urge.  Saw her cardiologist, Dr. Jacinto HalimGanji, for her irregular heart beat.  She has not seen the subspecialist for doing loop monitoring.  Denies irregular heartbeat.  Occasional dizziness, and she attributes this to her low BP. States SOB every now and then.  She did a sleep test in January and she has mild sleep apnea, and she has not followed up due to the pandemic.  She did Prolia in August, 2019 and then again in June, 2020.  Her PCP is ordering.   Having some stress during the pandemic.  Has a daughter living at home with her dog.   PCP: Jarome Matinaniel Paterson, MD   Patient's last menstrual period was 09/12/2000.           Sexually active: No.  The current method of family planning is post menopausal status.    Exercising: No.  The patient does not participate in regular exercise at present. is trying to do some walking Smoker:  no  Health Maintenance: Pap: 05-04-18 Neg, 04/28/15 Negative History of abnormal Pap:  no MMG: 02-27-19 Neg/density B/BiRads1 Colonoscopy:  01-25-13 normal;next 10 years BMD: 02-21-18  Result :Osteoporosis TDaP:  2013 Gardasil:   n/a ZOX:WRUEAHIV:never Hep C: never Screening Labs:   PCP.   reports that she has never smoked. She has never used smokeless tobacco. She reports current alcohol use of about 2.0 standard drinks of alcohol per week. She reports that she does not use drugs.  Past Medical History:  Diagnosis Date  . Depression   . Glaucoma   . Glaucoma   . HTN (hypertension)   . Hypothyroid 20 yrs ago  . Vitamin D deficiency disease 3/08    Past Surgical History:  Procedure Laterality Date  . GLAUCOMA SURGERY    . REFRACTIVE SURGERY Bilateral 12/2006  . thumb repair      Current Outpatient Medications  Medication Sig Dispense Refill  . BIOTIN PO Take  1 tablet by mouth daily.    Marland Kitchen. CALCIUM PO Take by mouth.    . fish oil-omega-3 fatty acids 1000 MG capsule Take 1 g by mouth daily.     . fluticasone (FLONASE) 50 MCG/ACT nasal spray Place 2 sprays into both nostrils 2 (two) times daily.     Marland Kitchen. glucosamine-chondroitin 500-400 MG tablet Take 1 tablet by mouth 3 (three) times daily.    . indapamide (LOZOL) 1.25 MG tablet Take 1.25 mg by mouth 2 (two) times a week. On Saturday and Wednesday    . levothyroxine (SYNTHROID, LEVOTHROID) 88 MCG tablet Take 88 mcg by mouth daily.      Marland Kitchen. loratadine (CLARITIN) 10 MG tablet Take 10 mg by mouth daily.    . Magnesium 400 MG TABS Take 1 tablet by mouth daily.    . Multiple Vitamins-Minerals (MULTIVITAMIN PO) Take by mouth.    . pantoprazole (PROTONIX) 40 MG tablet Take 40 mg by mouth daily.    . sertraline (ZOLOFT) 50 MG tablet Take 50 mg by mouth daily.    . Vitamin D, Ergocalciferol, (DRISDOL) 50000 UNITS CAPS Take 50,000 Units by mouth every 14 (fourteen) days.      No current facility-administered medications for this visit.     Family History  Problem Relation Age of Onset  . Stroke Mother  CVA  . Mitral valve prolapse Mother   . AAA (abdominal aortic aneurysm) Father   . Mitral valve prolapse Sister   . Atrial fibrillation Sister   . Non-Hodgkin's lymphoma Sister   . Breast cancer Other 47       neice    Review of Systems  All other systems reviewed and are negative.   Exam:   BP 130/70   Pulse 64 Comment: irregular--has had cardiac work up  [employer] 97.4 F (36.3 C) (Temporal)   Ht 5\' 1"  (1.549 m)   Wt 144 lb 12.8 oz (65.7 kg)   LMP 09/12/2000   BMI 27.36 kg/m     General appearance: alert, cooperative and appears stated age Head: normocephalic, without obvious abnormality, atraumatic Neck: no adenopathy, supple, symmetrical, trachea midline and thyroid normal to inspection and palpation Lungs: clear to auscultation bilaterally Breasts: normal appearance, no masses or  tenderness, No nipple retraction or dimpling, No nipple discharge or bleeding, No axillary adenopathy Heart: regular rate and rhythm Abdomen: soft, non-tender; no masses, no organomegaly Extremities: extremities normal, atraumatic, no cyanosis or edema Skin: skin color, texture, turgor normal. No rashes or lesions Lymph nodes: cervical, supraclavicular, and axillary nodes normal. Neurologic: grossly normal  Pelvic: External genitalia:  no lesions              No abnormal inguinal nodes palpated.              Urethra:  normal appearing urethra with no masses, tenderness or lesions              Bartholins and Skenes: normal                 Vagina: normal appearing vagina with normal color and discharge, no lesions              Cervix: no lesions              Pap taken: No. Bimanual Exam:  Uterus:  normal size, contour, position, consistency, mobility, non-tender              Adnexa: no mass, fullness, tenderness              Rectal exam: Yes.  .  Confirms.              Anus:  normal sphincter tone, no lesions  Chaperone was present for exam.  Assessment:   Well woman visit with normal exam. Osteoporosis of radius.  Increased rx risk by FRAX.  Wills tart Prolia through her PCP.  Hx low vit D.  Personal hx of fracture.  Reflux.  Plan: Mammogram screening discussed. Self breast awareness reviewed. Pap and HR HPV as above. Guidelines for Calcium, Vitamin D, regular exercise program including cardiovascular and weight bearing exercise. BMD next year through [place] - PCP ordering.  Flu vaccine recommended. Follow up annually and prn.    After visit summary provided.

## 2019-05-28 ENCOUNTER — Encounter: Payer: Self-pay | Admitting: Obstetrics and Gynecology

## 2019-05-28 ENCOUNTER — Other Ambulatory Visit: Payer: Self-pay

## 2019-05-28 ENCOUNTER — Ambulatory Visit (INDEPENDENT_AMBULATORY_CARE_PROVIDER_SITE_OTHER): Payer: Medicare Other | Admitting: Obstetrics and Gynecology

## 2019-05-28 VITALS — BP 130/70 | HR 64 | Temp 97.4°F | Ht 61.0 in | Wt 144.8 lb

## 2019-05-28 DIAGNOSIS — Z01419 Encounter for gynecological examination (general) (routine) without abnormal findings: Secondary | ICD-10-CM

## 2019-05-28 NOTE — Patient Instructions (Signed)

## 2019-07-27 ENCOUNTER — Ambulatory Visit
Admission: EM | Admit: 2019-07-27 | Discharge: 2019-07-27 | Disposition: A | Discharge status: Home / Self Care | Payer: Medicare Other

## 2019-07-27 ENCOUNTER — Encounter: Payer: Self-pay | Admitting: Emergency Medicine

## 2019-07-27 ENCOUNTER — Other Ambulatory Visit: Payer: Self-pay

## 2019-07-27 DIAGNOSIS — R0981 Nasal congestion: Secondary | ICD-10-CM | POA: Diagnosis not present

## 2019-07-27 DIAGNOSIS — Z20828 Contact with and (suspected) exposure to other viral communicable diseases: Secondary | ICD-10-CM

## 2019-07-27 LAB — POC SARS CORONAVIRUS 2 AG -  ED: SARS Coronavirus 2 Ag: NEGATIVE — NL

## 2019-07-27 NOTE — Discharge Instructions (Signed)
Rapid Covid negative.  PCR testing sent.  Please continue to quarantine until testing results return.  Continue to monitor temperature. You can take over the counter flonase/nasacort to help with nasal congestion/drainage. If experiencing shortness of breath, trouble breathing, go to the emergency department for further evaluation needed.

## 2019-07-27 NOTE — ED Notes (Signed)
Patient able to ambulate independently  

## 2019-07-27 NOTE — ED Triage Notes (Addendum)
Pt presents to [facility] for assessment of temperature of 99.5 last night.  Denies known exposure, but concerned because her husband is severely immunocompromised.  Patient states she is arthritic, but her body has been hurting.  States she has sinus issues, but she has had intermittent sore throat and nasal congestion.  Pt states she had 1 episode of diarrhea last night.

## 2019-07-27 NOTE — ED Provider Notes (Signed)
[facility]    CSN: [id number] Arrival date & time: 07/27/19  1336      History   Chief Complaint Chief Complaint  Patient presents with  . Fever    HPI Andrea Henderson is a 78 y.o. female.   78 year old female comes in for Covid testing.  Patient states noticed chills, and took her temperature, which was 99.5.  She had 1 episode of diarrhea last night that has since resolved.  Had normal bowel movements this morning.  She has baseline cough that has not worsened.  Denies rhinorrhea, nasal congestion.  Denies body aches.  Denies abdominal pain, nausea, vomiting.  She has intermittent shortness of breath at baseline that is being evaluated by cardiology.  Denies any worsening.  Denies current shortness of breath, chest pain, palpitation.  Denies loss of taste or smell.  No obvious sick/Covid contact.  States husband is immunocompromised, and would like to get tested for Covid.       Past Medical History:  Diagnosis Date  . Depression   . Glaucoma   . Glaucoma   . HTN (hypertension)   . Hypothyroid 20 yrs ago  . Vitamin D deficiency disease 3/08    Patient Active Problem List   Diagnosis Date Noted  . Decreased cardiac ejection fraction 09/28/2018  . Cardiac valve prolapse 09/28/2018  . Pulmonary hypertension ([place]) 09/28/2018  . Snoring 09/28/2018  . Chronic cardiac arrhythmia 09/28/2018  . 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 Osteoporosis 03/21/2018    Past Surgical History:  Procedure Laterality Date  . GLAUCOMA SURGERY    . REFRACTIVE SURGERY Bilateral 12/2006  . thumb repair      OB History    Gravida  2   Para  2   Term  2   Preterm  0   AB  0   Living  2     SAB  0   TAB  0   Ectopic  0   Multiple  0   Live Births  2            Home Medications    Prior to Admission medications   Medication Sig Start Date End Date Taking? Authorizing Provider  indapamide (LOZOL) 1.25 MG tablet Take 1.25 mg by mouth 2 (two) times a  week. On Saturday and Wednesday   Yes [provider]  Multiple Vitamins-Minerals (ONE-A-DAY VITACRAVES IMMUNITY PO) Take by mouth.   Yes [provider]  BIOTIN PO Take 1 tablet by mouth daily.    [provider]  CALCIUM PO Take by mouth.    [provider]  fish oil-omega-3 fatty acids 1000 MG capsule Take 1 g by mouth daily.     [provider]  fluticasone (FLONASE) 50 MCG/ACT nasal spray Place 2 sprays into both nostrils 2 (two) times daily.  03/14/13   [provider]  glucosamine-chondroitin 500-400 MG tablet Take 1 tablet by mouth 3 (three) times daily.    [provider]  levothyroxine (SYNTHROID, LEVOTHROID) 88 MCG tablet Take 88 mcg by mouth daily.      [provider]  loratadine (CLARITIN) 10 MG tablet Take 10 mg by mouth daily.    [provider]  Magnesium 400 MG TABS Take 1 tablet by mouth daily.    [provider]  Multiple Vitamins-Minerals (MULTIVITAMIN PO) Take by mouth.    [provider]  pantoprazole (PROTONIX) 40 MG tablet Take 40 mg by mouth daily. 08/03/18  [provider]  sertraline (ZOLOFT) 50 MG tablet Take 50 mg by mouth daily.    [provider]  Vitamin D, Ergocalciferol, (DRISDOL) 50000 UNITS CAPS Take 50,000 Units by mouth every 14 (fourteen) days.     [provider]    Family History Family History  Problem Relation Age of Onset  . Stroke Mother        CVA  . Mitral valve prolapse Mother   . AAA (abdominal aortic aneurysm) Father   . Mitral valve prolapse Sister   . Atrial fibrillation Sister   . Non-Hodgkin's lymphoma Sister   . Breast cancer Other 67       neice    Social History Social History   Tobacco Use  . Smoking status: Never Smoker  . Smokeless tobacco: Never Used  Substance Use Topics  . Alcohol use: Yes    Alcohol/week: 2.0 standard drinks    Types: 2 Glasses of wine per week    Comment: rare  . Drug  use: No     Allergies   Codeine and Sulfa antibiotics   Review of Systems Review of Systems  Reason unable to perform ROS: See HPI as above.     Physical Exam Triage Vital Signs ED Triage Vitals  Enc Vitals Group     BP 07/27/19 1409 116/74     Pulse Rate 07/27/19 1409 62     Resp 07/27/19 1409 16     Temp 07/27/19 1409 97.8 F (36.6 C)     Temp Source 07/27/19 1409 Temporal     SpO2 07/27/19 1409 94 %     Weight --      Height --      Head Circumference --      Peak Flow --      Pain Score 07/27/19 1408 0     Pain Loc --      Pain Edu? --      Excl. in GC? --    No data found.  Updated Vital Signs BP 116/74 (BP Location: Right Arm)   Pulse 62   Temp 97.8 F (36.6 C) (Temporal)   Resp 16   LMP 09/12/2000   SpO2 94%   Physical Exam Constitutional:      General: She is not in acute distress.    Appearance: Normal appearance. She is not ill-appearing, toxic-appearing or diaphoretic.  HENT:     Head: Normocephalic and atraumatic.     Mouth/Throat:     Mouth: Mucous membranes are moist.     Pharynx: Oropharynx is clear. Uvula midline.  Neck:     Musculoskeletal: Normal range of motion and neck supple.  Cardiovascular:     Rate and Rhythm: Normal rate and regular rhythm.     Heart sounds: Normal heart sounds. No murmur. No friction rub. No gallop.   Pulmonary:     Effort: Pulmonary effort is normal. No accessory muscle usage, prolonged expiration, respiratory distress or retractions.     Comments: Lungs clear to auscultation without adventitious lung sounds. Neurological:     General: No focal deficit present.     Mental Status: She is alert and oriented to person, place, and time.      UC Treatments / Results  Labs (all labs ordered are listed, but only abnormal results are displayed) Labs Reviewed  POC SARS CORONAVIRUS 2 ED - Abnormal; Notable for the following components:      Result Value   SARS Coronavirus 2 ([facility])  negative (*)     All other components within normal limits  NOVEL CORONAVIRUS, NAA    EKG   Radiology No results found.  Procedures Procedures (including critical care time)  Medications Ordered in UC Medications - No data to display  Initial Impression / Assessment and Plan / UC Course  I have reviewed the triage vital signs and the nursing notes.  Pertinent labs & imaging results that were available during my care of the patient were reviewed by me and considered in my medical decision making (see chart for details).    Rapid Covid negative.  PCR testing sent.  Patient to remain quarantined until testing results return.  Continue to monitor temperature.  Symptomatic treatment discussed.  Push fluids.  Return precautions given.  Patient expresses understanding and agrees to plan.  Final Clinical Impressions(s) / UC Diagnoses   Final diagnoses:  Nasal congestion   ED Prescriptions    None     PDMP not reviewed this encounter.   Belinda FisherYu, Amy V, PA-C 07/27/19 1550

## 2019-07-30 LAB — NOVEL CORONAVIRUS, NAA: SARS-CoV-2, NAA: NOT DETECTED

## 2019-09-13 DIAGNOSIS — H269 Unspecified cataract: Secondary | ICD-10-CM

## 2019-09-13 HISTORY — DX: Unspecified cataract: H26.9

## 2019-10-08 ENCOUNTER — Ambulatory Visit: Payer: Medicare Other

## 2019-10-18 ENCOUNTER — Ambulatory Visit: Payer: Medicare PPO

## 2020-02-13 ENCOUNTER — Other Ambulatory Visit (HOSPITAL_COMMUNITY): Payer: Self-pay | Admitting: *Deleted

## 2020-02-14 ENCOUNTER — Other Ambulatory Visit: Payer: Self-pay

## 2020-02-14 ENCOUNTER — Ambulatory Visit (HOSPITAL_COMMUNITY)
Admission: RE | Admit: 2020-02-14 | Discharge: 2020-02-14 | Disposition: A | Discharge status: Home / Self Care | Payer: Medicare PPO | Source: Ambulatory Visit | Attending: Internal Medicine | Admitting: Internal Medicine

## 2020-02-14 DIAGNOSIS — M810 Age-related osteoporosis without current pathological fracture: Secondary | ICD-10-CM | POA: Diagnosis present

## 2020-02-14 MED ORDER — DENOSUMAB 60 MG/ML ~~LOC~~ SOSY: 60.0000 mg | PREFILLED_SYRINGE | Freq: Once | SUBCUTANEOUS | Status: AC

## 2020-02-14 MED ORDER — DENOSUMAB 60 MG/ML ~~LOC~~ SOSY
PREFILLED_SYRINGE | SUBCUTANEOUS | Status: AC
  Administered 2020-02-14: 60 mg via SUBCUTANEOUS
  Filled 2020-02-14: qty 1

## 2020-03-13 DIAGNOSIS — M79671 Pain in right foot: Secondary | ICD-10-CM | POA: Diagnosis not present

## 2020-03-13 DIAGNOSIS — M25571 Pain in right ankle and joints of right foot: Secondary | ICD-10-CM | POA: Diagnosis not present

## 2020-03-13 DIAGNOSIS — M5416 Radiculopathy, lumbar region: Secondary | ICD-10-CM | POA: Diagnosis not present

## 2020-04-07 DIAGNOSIS — M545 Low back pain: Secondary | ICD-10-CM | POA: Diagnosis not present

## 2020-05-05 DIAGNOSIS — H40033 Anatomical narrow angle, bilateral: Secondary | ICD-10-CM | POA: Diagnosis not present

## 2020-05-05 DIAGNOSIS — H2513 Age-related nuclear cataract, bilateral: Secondary | ICD-10-CM | POA: Diagnosis not present

## 2020-05-05 DIAGNOSIS — H40013 Open angle with borderline findings, low risk, bilateral: Secondary | ICD-10-CM | POA: Diagnosis not present

## 2020-05-27 NOTE — Progress Notes (Signed)
79 y.o. G83P2002 Married Caucasian female here for annual exam.    Patient lost son to cardiac arrest 07/2019.  Received her Covid vaccine.   Patient wants to discuss BMD.  64 year old grandson is living with them.  PCP:  Jarome Matin, MD  Patient's last menstrual period was 09/12/2000.           Sexually active: No.  The current method of family planning is post menopausal status.    Exercising: No.  The patient does not participate in regular exercise at present.Does walk 5,000 steps daily Smoker:  no  Health Maintenance: Pap: 05-04-18 Neg, 04-28-15 Neg History of abnormal Pap:  no MMG: 03-02-20 3D/Neg/density B/BiRads1 Colonoscopy: 01-25-13 normal;next 10 years BMD:  03-02-20  Result :Osteopenia--PCP TDaP:  2013 Gardasil:   no OMB:TDHRC Hep C:never Screening Labs:  PCP   reports that she has never smoked. She has never used smokeless tobacco. She reports previous alcohol use. She reports that she does not use drugs.  Past Medical History:  Diagnosis Date  . Cataracts, both eyes 2021  . Depression   . Glaucoma   . Glaucoma   . HTN (hypertension)   . Hypothyroid 20 yrs ago  . Vitamin D deficiency disease 3/08    Past Surgical History:  Procedure Laterality Date  . GLAUCOMA SURGERY    . REFRACTIVE SURGERY Bilateral 12/2006  . thumb repair      Current Outpatient Medications  Medication Sig Dispense Refill  . BIOTIN PO Take 1 tablet by mouth daily.    Marland Kitchen CALCIUM PO Take by mouth.    . fish oil-omega-3 fatty acids 1000 MG capsule Take 1 g by mouth daily.     . fluticasone (FLONASE) 50 MCG/ACT nasal spray Place 2 sprays into both nostrils 2 (two) times daily.     Marland Kitchen glucosamine-chondroitin 500-400 MG tablet Take 1 tablet by mouth 3 (three) times daily.    . indapamide (LOZOL) 1.25 MG tablet Take 1.25 mg by mouth 2 (two) times a week. On Saturday and Wednesday    . levothyroxine (SYNTHROID, LEVOTHROID) 88 MCG tablet Take 88 mcg by mouth daily.      Marland Kitchen loratadine  (CLARITIN) 10 MG tablet Take 10 mg by mouth daily.    . Magnesium 400 MG TABS Take 1 tablet by mouth daily.    . Multiple Vitamins-Minerals (MULTIVITAMIN PO) Take by mouth.    . Multiple Vitamins-Minerals (ONE-A-DAY VITACRAVES IMMUNITY PO) Take by mouth.    . pantoprazole (PROTONIX) 40 MG tablet Take 40 mg by mouth daily.    . sertraline (ZOLOFT) 50 MG tablet Take 50 mg by mouth daily.    . Vitamin D, Ergocalciferol, (DRISDOL) 50000 UNITS CAPS Take 50,000 Units by mouth every 14 (fourteen) days.      No current facility-administered medications for this visit.    Family History  Problem Relation Age of Onset  . Stroke Mother        CVA  . Mitral valve prolapse Mother   . AAA (abdominal aortic aneurysm) Father   . Mitral valve prolapse Sister   . Atrial fibrillation Sister   . Non-Hodgkin's lymphoma Sister   . Breast cancer Other 47       neice    Review of Systems  All other systems reviewed and are negative.   Exam:   BP 124/70   Pulse 70   Resp 20   Ht 5' 0.75" (1.543 m)   Wt 143 lb (64.9 kg)  LMP 09/12/2000   BMI 27.24 kg/m     General appearance: alert, cooperative and appears stated age Head: normocephalic, without obvious abnormality, atraumatic Neck: no adenopathy, supple, symmetrical, trachea midline and thyroid normal to inspection and palpation Lungs: clear to auscultation bilaterally Breasts: normal appearance, no masses or tenderness, No nipple retraction or dimpling, No nipple discharge or bleeding, No axillary adenopathy Heart: regular rate and rhythm Abdomen: soft, non-tender; no masses, no organomegaly Extremities: extremities normal, atraumatic, no cyanosis or edema Skin: skin color, texture, turgor normal. No rashes or lesions Lymph nodes: cervical, supraclavicular, and axillary nodes normal. Neurologic: grossly normal  Pelvic: External genitalia:  no lesions              No abnormal inguinal nodes palpated.              Urethra:  normal appearing  urethra with no masses, tenderness or lesions              Bartholins and Skenes: normal                 Vagina: normal appearing vagina with normal color and discharge, no lesions              Cervix: no lesions              Pap taken: Yes.   Bimanual Exam:  Uterus:  normal size, contour, position, consistency, mobility, non-tender              Adnexa: no mass, fullness, tenderness              Rectal exam: Yes.  .  Confirms.              Anus:  normal sphincter tone, no lesions  Chaperone was present for exam.  Assessment:   Well woman visit with normal exam. Hx osteoporosis of radius. Increased rx risk by FRAX. On Prolia through her PCP.  Hx low vit D.  On high dose vit D.  Personal hx of fracture.  Reflux. Bereavement.   Plan: Mammogram screening discussed. Self breast awareness reviewed. Pap and HR HPV as above. Guidelines for Calcium, Vitamin D, regular exercise program including cardiovascular and weight bearing exercise. BMD reviewed.  Now has osteopenia.  She will continue her Prolia.  Next BMD in 2 years.  Support given for the loss of her son.  Follow up annually and prn.   After visit summary provided.

## 2020-05-28 ENCOUNTER — Encounter: Payer: Self-pay | Admitting: Obstetrics and Gynecology

## 2020-05-28 ENCOUNTER — Ambulatory Visit (INDEPENDENT_AMBULATORY_CARE_PROVIDER_SITE_OTHER): Payer: Medicare PPO | Admitting: Obstetrics and Gynecology

## 2020-05-28 ENCOUNTER — Other Ambulatory Visit: Payer: Self-pay

## 2020-05-28 ENCOUNTER — Other Ambulatory Visit (HOSPITAL_COMMUNITY)
Admission: RE | Admit: 2020-05-28 | Discharge: 2020-05-28 | Disposition: A | Discharge status: Home / Self Care | Payer: Medicare PPO | Source: Ambulatory Visit | Attending: Obstetrics and Gynecology | Admitting: Obstetrics and Gynecology

## 2020-05-28 VITALS — BP 124/70 | HR 70 | Resp 20 | Ht 60.75 in | Wt 143.0 lb

## 2020-05-28 DIAGNOSIS — Z01419 Encounter for gynecological examination (general) (routine) without abnormal findings: Secondary | ICD-10-CM | POA: Diagnosis not present

## 2020-05-28 NOTE — Patient Instructions (Signed)

## 2020-05-29 LAB — CYTOLOGY - PAP: Diagnosis: NEGATIVE

## 2020-06-12 ENCOUNTER — Ambulatory Visit (HOSPITAL_BASED_OUTPATIENT_CLINIC_OR_DEPARTMENT_OTHER)
Admit: 2020-06-12 | Discharge: 2020-06-12 | Disposition: A | Discharge status: Home / Self Care | Payer: Medicare PPO | Attending: *Deleted | Admitting: *Deleted

## 2020-06-12 ENCOUNTER — Telehealth: Payer: Self-pay | Admitting: Emergency Medicine

## 2020-06-12 ENCOUNTER — Ambulatory Visit
Admission: EM | Admit: 2020-06-12 | Discharge: 2020-06-12 | Disposition: A | Discharge status: Home / Self Care | Payer: Medicare PPO | Attending: Emergency Medicine | Admitting: Emergency Medicine

## 2020-06-12 ENCOUNTER — Encounter: Payer: Self-pay | Admitting: Emergency Medicine

## 2020-06-12 ENCOUNTER — Other Ambulatory Visit: Payer: Self-pay

## 2020-06-12 DIAGNOSIS — M7989 Other specified soft tissue disorders: Secondary | ICD-10-CM | POA: Insufficient documentation

## 2020-06-12 DIAGNOSIS — L538 Other specified erythematous conditions: Secondary | ICD-10-CM

## 2020-06-12 LAB — COMPREHENSIVE METABOLIC PANEL
ALT: 16 IU/L (ref 0–32)
AST: 23 IU/L (ref 0–40)
Albumin/Globulin Ratio: 2 (ref 1.2–2.2)
Albumin: 4.3 g/dL (ref 3.7–4.7)
Alkaline Phosphatase: 66 IU/L (ref 44–121)
BUN/Creatinine Ratio: 11 — ABNORMAL LOW (ref 12–28)
BUN: 8 mg/dL (ref 8–27)
Bilirubin Total: 0.2 mg/dL (ref 0.0–1.2)
CO2: 26 mmol/L (ref 20–29)
Calcium: 9.6 mg/dL (ref 8.7–10.3)
Chloride: 105 mmol/L (ref 96–106)
Creatinine, Ser: 0.72 mg/dL (ref 0.57–1.00)
GFR calc Af Amer: 93 mL/min/{1.73_m2} (ref >59)
GFR calc non Af Amer: 81 mL/min/{1.73_m2} (ref >59)
Globulin, Total: 2.2 g/dL (ref 1.5–4.5)
Glucose: 125 mg/dL — ABNORMAL HIGH (ref 65–99)
Potassium: 3.7 mmol/L (ref 3.5–5.2)
Sodium: 140 mmol/L (ref 134–144)
Total Protein: 6.5 g/dL (ref 6.0–8.5)

## 2020-06-12 LAB — CBC
Hematocrit: 38.1 % (ref 34.0–46.6)
Hemoglobin: 12.8 g/dL (ref 11.1–15.9)
MCH: 32.1 pg (ref 26.6–33.0)
MCHC: 33.6 g/dL (ref 31.5–35.7)
MCV: 96 fL (ref 79–97)
Platelets: 211 10*3/uL (ref 150–450)
RBC: 3.99 x10E6/uL (ref 3.77–5.28)
RDW: 13 % (ref 11.7–15.4)
WBC: 6.4 10*3/uL (ref 3.4–10.8)

## 2020-06-12 MED ORDER — CEPHALEXIN 500 MG PO CAPS: 500.0000 mg | ORAL_CAPSULE | Freq: Three times a day (TID) | ORAL | 0 refills | Status: AC

## 2020-06-12 MED ORDER — DOXYCYCLINE HYCLATE 50 MG PO CAPS: 100.0000 mg | ORAL_CAPSULE | Freq: Two times a day (BID) | ORAL | 0 refills | Status: AC

## 2020-06-12 NOTE — ED Triage Notes (Signed)
Pt here with bruising to both lower legs with some pain; pt sts had covid booster on Monday; pt noticed redness/bruising on Wednesday; pt also has right wrist pain with swelling; pt sts hx of tendonitis in same area; denies obvious injury to any area

## 2020-06-12 NOTE — Telephone Encounter (Cosign Needed)
Patient's venous ultrasound returned with no evidence of DVT.  We will treat empirically for cellulitis with both doxycycline and Keflex.  Patient was given return precautions on the phone.  All patient questions were answered.

## 2020-06-12 NOTE — Progress Notes (Signed)
Lower extremity venous bilateral study completed.   Please see CV Proc for preliminary results.   Shakeia Krus, RDMS  

## 2020-06-12 NOTE — Discharge Instructions (Signed)
Please keep your appointment for a bilateral venous ultrasound today at 3:00 PM. Please go to [facility] and enter the building to Entrance C.  Please let them know that you are here for a bilateral venous ultrasound.

## 2020-06-12 NOTE — ED Provider Notes (Signed)
Emergency Department Provider Note  ____________________________________________  Time seen: Approximately 11:50 AM  I have reviewed the triage vital signs and the nursing notes.   HISTORY  Chief Complaint Wrist Pain and Leg Pain   Historian Patient     HPI Andrea Henderson is a 79 y.o. female presents to the emergency department with bilateral lower extremity erythema and edema, right worse than left.  Patient also states that she has had some acute right wrist pain.  She denies falls or mechanisms of trauma.  She denies prolonged immobilization, recent surgery, daily smoking, use of supplemental hormones.  No chest pain, chest tightness or shortness of breath.  Patient has been able to ambulate easily.  She denies fever and chills at home.  No recent history of cellulitis.  Patient states that aside from receiving her booster shot recently, she has had no new medications or medical interventions.  Denies a history of gout.  No other alleviating measures have been attempted.   Past Medical History:  Diagnosis Date  . Cataracts, both eyes 2021  . Depression   . Glaucoma   . Glaucoma   . HTN (hypertension)   . Hypothyroid 20 yrs ago  . Vitamin D deficiency disease 3/08     Immunizations up to date:  Yes.     Past Medical History:  Diagnosis Date  . Cataracts, both eyes 2021  . Depression   . Glaucoma   . Glaucoma   . HTN (hypertension)   . Hypothyroid 20 yrs ago  . Vitamin D deficiency disease 3/08    Patient Active Problem List   Diagnosis Date Noted  . Decreased cardiac ejection fraction 09/28/2018  . Cardiac valve prolapse 09/28/2018  . Pulmonary hypertension ([facility]) 09/28/2018  . Snoring 09/28/2018  . Chronic cardiac arrhythmia 09/28/2018  . 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 Osteoporosis 03/21/2018    Past Surgical History:  Procedure Laterality Date  . GLAUCOMA SURGERY    . REFRACTIVE SURGERY Bilateral 12/2006  . thumb repair      Prior to  Admission medications   Medication Sig Start Date End Date Taking? Authorizing Provider  BIOTIN PO Take 1 tablet by mouth daily.    [provider]  CALCIUM PO Take by mouth.    [provider]  fish oil-omega-3 fatty acids 1000 MG capsule Take 1 g by mouth daily.     [provider]  fluticasone (FLONASE) 50 MCG/ACT nasal spray Place 2 sprays into both nostrils 2 (two) times daily.  03/14/13   [provider]  glucosamine-chondroitin 500-400 MG tablet Take 1 tablet by mouth 3 (three) times daily.    [provider]  indapamide (LOZOL) 1.25 MG tablet Take 1.25 mg by mouth 2 (two) times a week. On Saturday and Wednesday    [provider]  levothyroxine (SYNTHROID, LEVOTHROID) 88 MCG tablet Take 88 mcg by mouth daily.      [provider]  loratadine (CLARITIN) 10 MG tablet Take 10 mg by mouth daily.    [provider]  Magnesium 400 MG TABS Take 1 tablet by mouth daily.    [provider]  Multiple Vitamins-Minerals (MULTIVITAMIN PO) Take by mouth.    [provider]  Multiple Vitamins-Minerals (ONE-A-DAY VITACRAVES IMMUNITY PO) Take by mouth.    [provider]  pantoprazole (PROTONIX) 40 MG tablet Take 40 mg by mouth daily. 08/03/18   [provider]  sertraline (ZOLOFT) 50 MG tablet Take 50 mg by mouth daily.  [provider]  Vitamin D, Ergocalciferol, (DRISDOL) 50000 UNITS CAPS Take 50,000 Units by mouth every 14 (fourteen) days.     [provider]    Allergies Codeine and Sulfa antibiotics  Family History  Problem Relation Age of Onset  . Stroke Mother        CVA  . Mitral valve prolapse Mother   . AAA (abdominal aortic aneurysm) Father   . Mitral valve prolapse Sister   . Atrial fibrillation Sister   . Non-Hodgkin's lymphoma Sister   . Breast cancer Other 38       neice    Social History Social History   Tobacco Use  . Smoking status: Never Smoker   . Smokeless tobacco: Never Used  Vaping Use  . Vaping Use: Never used  Substance Use Topics  . Alcohol use: Not Currently    Comment: rare  . Drug use: No     Review of Systems  Constitutional: No fever/chills Eyes:  No discharge ENT: No upper respiratory complaints. Respiratory: no cough. No SOB/ use of accessory muscles to breath Gastrointestinal:   No nausea, no vomiting.  No diarrhea.  No constipation. Musculoskeletal: Negative for musculoskeletal pain. Skin: Patient has lower extremity erythema and swelling.    ____________________________________________   PHYSICAL EXAM:  VITAL SIGNS: ED Triage Vitals  Enc Vitals Group     BP 06/12/20 1111 110/63     Pulse Rate 06/12/20 1111 67     Resp 06/12/20 1111 18     Temp 06/12/20 1111 99.8 F (37.7 C)     Temp Source 06/12/20 1111 Oral     SpO2 06/12/20 1111 97 %     Weight --      Height --      Head Circumference --      Peak Flow --      Pain Score 06/12/20 1112 5     Pain Loc --      Pain Edu? --      Excl. in GC? --      Constitutional: Alert and oriented. Well appearing and in no acute distress. Eyes: Conjunctivae are normal. PERRL. EOMI. Head: Atraumatic. Cardiovascular: Normal rate, regular rhythm. Normal S1 and S2.  Good peripheral circulation. Respiratory: Normal respiratory effort without tachypnea or retractions. Lungs CTAB. Good air entry to the bases with no decreased or absent breath sounds Gastrointestinal: Bowel sounds x 4 quadrants. Soft and nontender to palpation. No guarding or rigidity. No distention. Musculoskeletal: Full range of motion to all extremities. No obvious deformities noted.  Patient has symmetric strength in the lower extremities.  Palpable dorsalis pedis pulse bilaterally and symmetrically. Neurologic:  Normal for age. No gross focal neurologic deficits are appreciated.  Skin: Patient has erythema of the right calf with 2+ pitting edema.  Patient has a 3 cm x 3 cm region of  erythema along the left calf. Psychiatric: Mood and affect are normal for age. Speech and behavior are normal.   ____________________________________________   LABS (all labs ordered are listed, but only abnormal results are displayed)  Labs Reviewed  CBC  COMPREHENSIVE METABOLIC PANEL   ____________________________________________  EKG   ____________________________________________  RADIOLOGY   No results found.  ____________________________________________    PROCEDURES  Procedure(s) performed:     Procedures     Medications - No data to display   ____________________________________________   INITIAL IMPRESSION / ASSESSMENT AND PLAN / ED COURSE  Pertinent labs & imaging results that were available during my care  of the patient were reviewed by me and considered in my medical decision making (see chart for details).      Assessment and Plan:  Lower extremity swelling Wrist pain 79 year old female presents to the emergency department with bilateral lower extremity swelling and erythema over the past 2 days.  Vital signs were reassuring at triage.  On physical exam, patient had circumferential erythema of the right lower extremity with 2+ pitting edema and a 3 cm x 3 cm region of erythema along the left calf.  Differential diagnosis includes DVT, cellulitis, acute kidney injury, vaccination reaction...  We will obtain CBC and CMP and obtain bilateral venous ultrasound.  I spoke with Dr. At [facility] and schedule patient for an outpatient venous ultrasound bilaterally at 3:00 PM today.  Patient was given instructions for the hospital.  Informed patient that I will call her with results when they pain today.  I cautioned patient that should she experience worsening symptoms, she should seek care with the emergency department.    ____________________________________________  FINAL CLINICAL IMPRESSION(S) / ED DIAGNOSES  Final diagnoses:  Leg  swelling      NEW MEDICATIONS STARTED DURING THIS VISIT:  ED Discharge Orders         Ordered    LE VENOUS        06/12/20 1138              This chart was dictated using voice recognition software/Dragon. Despite best efforts to proofread, errors can occur which can change the meaning. Any change was purely unintentional.     Orvil Feil, PA-C 06/12/20 1155

## 2020-09-18 DIAGNOSIS — H2513 Age-related nuclear cataract, bilateral: Secondary | ICD-10-CM | POA: Diagnosis not present

## 2020-09-18 DIAGNOSIS — H40033 Anatomical narrow angle, bilateral: Secondary | ICD-10-CM | POA: Diagnosis not present

## 2020-09-18 DIAGNOSIS — H40013 Open angle with borderline findings, low risk, bilateral: Secondary | ICD-10-CM | POA: Diagnosis not present

## 2020-09-18 DIAGNOSIS — H25013 Cortical age-related cataract, bilateral: Secondary | ICD-10-CM | POA: Diagnosis not present

## 2020-09-18 DIAGNOSIS — H2511 Age-related nuclear cataract, right eye: Secondary | ICD-10-CM | POA: Diagnosis not present

## 2020-09-21 ENCOUNTER — Other Ambulatory Visit (HOSPITAL_COMMUNITY): Payer: Self-pay | Admitting: *Deleted

## 2020-09-22 ENCOUNTER — Ambulatory Visit (HOSPITAL_COMMUNITY)
Admission: RE | Admit: 2020-09-22 | Discharge: 2020-09-22 | Disposition: A | Discharge status: Home / Self Care | Payer: Medicare PPO | Source: Ambulatory Visit | Attending: Internal Medicine | Admitting: Internal Medicine

## 2020-09-22 ENCOUNTER — Other Ambulatory Visit: Payer: Self-pay

## 2020-09-22 DIAGNOSIS — M810 Age-related osteoporosis without current pathological fracture: Secondary | ICD-10-CM | POA: Diagnosis not present

## 2020-09-22 MED ORDER — DENOSUMAB 60 MG/ML ~~LOC~~ SOSY
60.0000 mg | PREFILLED_SYRINGE | Freq: Once | SUBCUTANEOUS | Status: AC
  Administered 2020-09-22: 60 mg via SUBCUTANEOUS

## 2020-09-29 DIAGNOSIS — Z20818 Contact with and (suspected) exposure to other bacterial communicable diseases: Secondary | ICD-10-CM | POA: Diagnosis not present

## 2020-09-29 DIAGNOSIS — G4733 Obstructive sleep apnea (adult) (pediatric): Secondary | ICD-10-CM | POA: Diagnosis not present

## 2020-09-29 DIAGNOSIS — R059 Cough, unspecified: Secondary | ICD-10-CM | POA: Diagnosis not present

## 2020-09-29 DIAGNOSIS — J302 Other seasonal allergic rhinitis: Secondary | ICD-10-CM | POA: Diagnosis not present

## 2020-09-29 DIAGNOSIS — R062 Wheezing: Secondary | ICD-10-CM | POA: Diagnosis not present

## 2020-10-13 DIAGNOSIS — Z8616 Personal history of COVID-19: Secondary | ICD-10-CM

## 2020-10-13 HISTORY — PX: CATARACT EXTRACTION: SUR2

## 2020-10-13 HISTORY — DX: Personal history of COVID-19: Z86.16

## 2020-10-28 DIAGNOSIS — H2511 Age-related nuclear cataract, right eye: Secondary | ICD-10-CM | POA: Diagnosis not present

## 2020-10-28 DIAGNOSIS — H25811 Combined forms of age-related cataract, right eye: Secondary | ICD-10-CM | POA: Diagnosis not present

## 2020-12-07 DIAGNOSIS — H25012 Cortical age-related cataract, left eye: Secondary | ICD-10-CM | POA: Diagnosis not present

## 2020-12-07 DIAGNOSIS — H2512 Age-related nuclear cataract, left eye: Secondary | ICD-10-CM | POA: Diagnosis not present

## 2021-01-25 DIAGNOSIS — E559 Vitamin D deficiency, unspecified: Secondary | ICD-10-CM | POA: Diagnosis not present

## 2021-01-25 DIAGNOSIS — E039 Hypothyroidism, unspecified: Secondary | ICD-10-CM | POA: Diagnosis not present

## 2021-01-25 DIAGNOSIS — R739 Hyperglycemia, unspecified: Secondary | ICD-10-CM | POA: Diagnosis not present

## 2021-02-01 DIAGNOSIS — G4733 Obstructive sleep apnea (adult) (pediatric): Secondary | ICD-10-CM | POA: Diagnosis not present

## 2021-02-01 DIAGNOSIS — I272 Pulmonary hypertension, unspecified: Secondary | ICD-10-CM | POA: Diagnosis not present

## 2021-02-01 DIAGNOSIS — R739 Hyperglycemia, unspecified: Secondary | ICD-10-CM | POA: Diagnosis not present

## 2021-02-01 DIAGNOSIS — I10 Essential (primary) hypertension: Secondary | ICD-10-CM | POA: Diagnosis not present

## 2021-02-01 DIAGNOSIS — R82998 Other abnormal findings in urine: Secondary | ICD-10-CM | POA: Diagnosis not present

## 2021-02-01 DIAGNOSIS — Z1339 Encounter for screening examination for other mental health and behavioral disorders: Secondary | ICD-10-CM | POA: Diagnosis not present

## 2021-02-01 DIAGNOSIS — M810 Age-related osteoporosis without current pathological fracture: Secondary | ICD-10-CM | POA: Diagnosis not present

## 2021-02-01 DIAGNOSIS — M542 Cervicalgia: Secondary | ICD-10-CM | POA: Diagnosis not present

## 2021-02-01 DIAGNOSIS — Z0000 Encounter for general adult medical examination without abnormal findings: Secondary | ICD-10-CM | POA: Diagnosis not present

## 2021-02-01 DIAGNOSIS — Z1331 Encounter for screening for depression: Secondary | ICD-10-CM | POA: Diagnosis not present

## 2021-02-24 DIAGNOSIS — H25012 Cortical age-related cataract, left eye: Secondary | ICD-10-CM | POA: Diagnosis not present

## 2021-02-24 DIAGNOSIS — H25812 Combined forms of age-related cataract, left eye: Secondary | ICD-10-CM | POA: Diagnosis not present

## 2021-02-24 DIAGNOSIS — H2512 Age-related nuclear cataract, left eye: Secondary | ICD-10-CM | POA: Diagnosis not present

## 2021-03-11 ENCOUNTER — Ambulatory Visit: Payer: Medicare PPO | Admitting: Family Medicine

## 2021-03-11 DIAGNOSIS — Z1231 Encounter for screening mammogram for malignant neoplasm of breast: Secondary | ICD-10-CM | POA: Diagnosis not present

## 2021-03-19 DIAGNOSIS — L821 Other seborrheic keratosis: Secondary | ICD-10-CM | POA: Diagnosis not present

## 2021-03-19 DIAGNOSIS — L814 Other melanin hyperpigmentation: Secondary | ICD-10-CM | POA: Diagnosis not present

## 2021-04-12 ENCOUNTER — Other Ambulatory Visit (HOSPITAL_COMMUNITY): Payer: Self-pay | Admitting: *Deleted

## 2021-04-13 ENCOUNTER — Ambulatory Visit (HOSPITAL_COMMUNITY)
Admission: RE | Admit: 2021-04-13 | Discharge: 2021-04-13 | Disposition: A | Discharge status: Home / Self Care | Payer: Medicare PPO | Source: Ambulatory Visit | Attending: Internal Medicine | Admitting: Internal Medicine

## 2021-04-13 DIAGNOSIS — M810 Age-related osteoporosis without current pathological fracture: Secondary | ICD-10-CM | POA: Insufficient documentation

## 2021-04-13 MED ORDER — DENOSUMAB 60 MG/ML ~~LOC~~ SOSY: 60.0000 mg | PREFILLED_SYRINGE | Freq: Once | SUBCUTANEOUS | Status: DC

## 2021-04-13 MED ORDER — DENOSUMAB 60 MG/ML ~~LOC~~ SOSY
PREFILLED_SYRINGE | SUBCUTANEOUS | Status: AC
  Administered 2021-04-13: 60 mg
  Filled 2021-04-13: qty 1

## 2021-05-31 ENCOUNTER — Other Ambulatory Visit: Payer: Self-pay

## 2021-05-31 ENCOUNTER — Encounter: Payer: Self-pay | Admitting: Obstetrics and Gynecology

## 2021-05-31 ENCOUNTER — Ambulatory Visit (INDEPENDENT_AMBULATORY_CARE_PROVIDER_SITE_OTHER): Payer: Medicare PPO | Admitting: Obstetrics and Gynecology

## 2021-05-31 VITALS — BP 110/68 | HR 58 | Ht 61.0 in | Wt 144.0 lb

## 2021-05-31 DIAGNOSIS — E7800 Pure hypercholesterolemia, unspecified: Secondary | ICD-10-CM

## 2021-05-31 DIAGNOSIS — M8580 Other specified disorders of bone density and structure, unspecified site: Secondary | ICD-10-CM | POA: Diagnosis not present

## 2021-05-31 DIAGNOSIS — Z01419 Encounter for gynecological examination (general) (routine) without abnormal findings: Secondary | ICD-10-CM | POA: Diagnosis not present

## 2021-05-31 NOTE — Progress Notes (Signed)
GYNECOLOGY  VISIT   HPI: 80 y.o.   Married  Caucasian  female   G2P2002 with Patient's last menstrual period was 09/12/2000.   here for breast and pelvic exam.   Brings in her blood work for me to review.  Cholesterol is elevated at 219 and LDL 146.  She is doing Geophysicist/field seismologist and walking the dog. She wants dietary recommendations.   Some urinary incontinence if she takes a diuretic.  Hard to get to the bathroom on time.  No leak with cough, laugh or sneeze.   She wants to discuss her bone density also.  PCP prescribing Prolia.  She is high dose vitamin D.  Her level 65.1.  GYNECOLOGIC HISTORY: Patient's last menstrual period was 09/12/2000. Contraception: PMP Menopausal hormone therapy:  none Last mammogram: 03-11-21 3D/Neg/Birads1 Last pap smear:  05-28-20 Neg, 05-04-18 Neg, 04-28-15 Neg  SCREENING TESTING: Bone density [facility] 03/02/20:  Left radius T score -1.7 (was -2.9), right femur neck -2.1, left femur neck -1.7, spine -0.50. 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     CALCIUM PO Take by mouth.     denosumab (PROLIA) 60 MG/ML SOSY injection Inject 60 mg into the skin every 6 (six) months.     fish oil-omega-3 fatty acids 1000 MG capsule Take 1 g by mouth daily.      fluticasone (FLONASE) 50 MCG/ACT nasal spray Place 2 sprays into both nostrils 2 (two) times daily.      glucosamine-chondroitin 500-400 MG tablet Take 1 tablet by mouth 3 (three) times daily.     indapamide (LOZOL) 1.25 MG tablet Take 1.25 mg by mouth 2 (two) times a week. On Saturday and Wednesday     levothyroxine (SYNTHROID, LEVOTHROID) 88 MCG tablet Take 88 mcg by mouth daily.       loratadine (CLARITIN) 10 MG tablet Take 10 mg by mouth daily.     Magnesium 400 MG TABS Take 1 tablet by mouth daily.     Multiple Vitamins-Minerals (MULTIVITAMIN PO) Take by mouth.     Multiple Vitamins-Minerals (ONE-A-DAY VITACRAVES IMMUNITY PO) Take by mouth.     pantoprazole (PROTONIX) 40 MG tablet Take 40 mg by mouth daily.  sertraline (ZOLOFT) 50 MG tablet Take 50 mg by mouth daily.     Vitamin D, Ergocalciferol, (DRISDOL) 50000 UNITS CAPS Take 50,000 Units by mouth every 14 (fourteen) days.      No current facility-administered medications for this visit.     ALLERGIES: Codeine and Sulfa antibiotics  Family History  Problem Relation Age of Onset   Stroke Mother        CVA   Mitral valve prolapse Mother    AAA (abdominal aortic aneurysm) Father    Mitral valve prolapse Sister    Atrial fibrillation Sister    Non-Hodgkin's lymphoma Sister    Breast cancer Other 71       neice    Social History   Socioeconomic History   Marital status: Married    Spouse name: Not on file   Number of children: Not on file   Years of education: Not on file   Highest education level: Not on file  Occupational History   Not on file  Tobacco Use   Smoking status: Never   Smokeless tobacco: Never  Vaping Use   Vaping Use: Never used  Substance and  Sexual Activity   Alcohol use: Yes    Comment: occ beer   Drug use: No   Sexual activity: Not Currently    Partners: Male    Birth control/protection: Post-menopausal  Other Topics Concern   Not on file  Social History Narrative   Not on file   Social Determinants of Health   Financial Resource Strain: Not on file  Food Insecurity: Not on file  Transportation Needs: Not on file  Physical Activity: Not on file  Stress: Not on file  Social Connections: Not on file  Intimate Partner Violence: Not on file    Review of Systems  All other systems reviewed and are negative.  PHYSICAL EXAMINATION:    BP 110/68   Pulse (!) 58   Ht 5\' 1"  (1.549 m)   Wt 144 lb (65.3 kg)   LMP 09/12/2000   SpO2 96%   BMI 27.21 kg/m     General appearance: alert, cooperative and appears stated age Head: Normocephalic, without obvious abnormality, atraumatic Neck: no adenopathy, supple, symmetrical, trachea midline and thyroid normal to inspection and palpation Lungs: clear to auscultation bilaterally Breasts: normal appearance, no masses or tenderness, No nipple retraction or dimpling, No nipple discharge or bleeding, No axillary or supraclavicular adenopathy Heart: regular rate and rhythm Abdomen: soft, non-tender, no masses,  no organomegaly Extremities: extremities normal, atraumatic, no cyanosis or edema Skin: Skin color, texture, turgor normal. No rashes or lesions Lymph nodes: Cervical, supraclavicular, and axillary nodes normal. No abnormal inguinal nodes palpated Neurologic: Grossly normal  Pelvic: External genitalia:  no lesions              Urethra:  normal appearing urethra with no masses, tenderness or lesions              Bartholins and Skenes: normal                 Vagina: normal appearing vagina with normal color and discharge, no lesions              Cervix: no lesions                Bimanual Exam:  Uterus:  normal size, contour, position, consistency, mobility, non-tender               Adnexa: no mass, fullness, tenderness  Rectal exam: yes.  Confirms.              Anus:  normal sphincter tone, no lesions  Chaperone was present for exam:  Marchelle Folks, CMA.   ASSESSMENT  Well woman with gynecologic exam.  Hx osteoporosis of radius.  Now with osteopenia on Prolia through her PCP.  Hx low vit D.  On high dose vit D.  Personal hx of fracture.  Reflux. Elevated cholesterol.   PLAN  Yearly mammogram.  Self breast exam encouraged.  Cervical cancer screening in 2 years.  Continue Prolia. Daily calcium intake of 1200 mg daily.  Continue current vit D regimen. Next BMD in 2023.  We discussed a mediterranean, low cholesterol diet.  Continue physical activity.  FU in 2 years and prn.   An After Visit Summary was printed and given to the patient.  26 min  total time was spent for this patient encounter, including preparation, face-to-face counseling with the patient, coordination of care, and documentation of the encounter.

## 2021-05-31 NOTE — Patient Instructions (Addendum)
EXERCISE AND DIET:  We recommended that you start or continue a regular exercise program for good health. Regular exercise means any activity that makes your heart beat faster and makes you sweat.  We recommend exercising at least 30 minutes per day at least 3 days a week, preferably 4 or 5.  We also recommend a diet low in fat and sugar.  Inactivity, poor dietary choices and obesity can cause diabetes, heart attack, stroke, and kidney damage, among others.    ALCOHOL AND SMOKING:  Women should limit their alcohol intake to no more than 7 drinks/beers/glasses of wine (combined, not each!) per week. Moderation of alcohol intake to this level decreases your risk of breast cancer and liver damage. And of course, no recreational drugs are part of a healthy lifestyle.  And absolutely no smoking or even second hand smoke. Most people know smoking can cause heart and lung diseases, but did you know it also contributes to weakening of your bones? Aging of your skin?  Yellowing of your teeth and nails?  CALCIUM AND VITAMIN D:  Adequate intake of calcium and Vitamin D are recommended.  The recommendations for exact amounts of these supplements seem to change often, but generally speaking 600 mg of calcium (either carbonate or citrate) and 800 units of Vitamin D per day seems prudent. Certain women may benefit from higher intake of Vitamin D.  If you are among these women, your doctor will have told you during your visit.    PAP SMEARS:  Pap smears, to check for cervical cancer or precancers,  have traditionally been done yearly, although recent scientific advances have shown that most women can have pap smears less often.  However, every woman still should have a physical exam from her gynecologist every year. It will include a breast check, inspection of the vulva and vagina to check for abnormal growths or skin changes, a visual exam of the cervix, and then an exam to evaluate the size and shape of the uterus and  ovaries.  And after 78 years of age, a rectal exam is indicated to check for rectal cancers. We will also provide age appropriate advice regarding health maintenance, like when you should have certain vaccines, screening for sexually transmitted diseases, bone density testing, colonoscopy, mammograms, etc.   MAMMOGRAMS:  All women over 40 years old should have a yearly mammogram. Many facilities now offer a "3D" mammogram, which may cost around $50 extra out of pocket. If possible,  we recommend you accept the option to have the 3D mammogram performed.  It both reduces the number of women who will be called back for extra views which then turn out to be normal, and it is better than the routine mammogram at detecting truly abnormal areas.    COLONOSCOPY:  Colonoscopy to screen for colon cancer is recommended for all women at age 50.  We know, you hate the idea of the prep.  We agree, BUT, having colon cancer and not knowing it is worse!!  Colon cancer so often starts as a polyp that can be seen and removed at colonscopy, which can quite literally save your life!  And if your first colonoscopy is normal and you have no family history of colon cancer, most women don't have to have it again for 10 years.  Once every ten years, you can do something that may end up saving your life, right?  We will be happy to help you get it scheduled when you are ready.    Be sure to check your insurance coverage so you understand how much it will cost.  It may be covered as a preventative service at no cost, but you should check your particular policy.         Why follow it? Research shows. Those who follow the Mediterranean diet have a reduced risk of heart disease  The diet is associated with a reduced incidence of Parkinson's and Alzheimer's diseases People following the diet may have longer life expectancies and lower rates of chronic diseases  The Dietary Guidelines for Americans recommends the Mediterranean diet as an  eating plan to promote health and prevent disease  What Is the Mediterranean Diet?  Healthy eating plan based on typical foods and recipes of Mediterranean-style cooking The diet is primarily a plant based diet; these foods should make up a majority of meals   Starches - Plant based foods should make up a majority of meals - They are an important sources of vitamins, minerals, energy, antioxidants, and fiber - Choose whole grains, foods high in fiber and minimally processed items  - Typical grain sources include wheat, oats, barley, corn, brown rice, bulgar, farro, millet, polenta, couscous  - Various types of beans include chickpeas, lentils, fava beans, black beans, white beans   Fruits  Veggies - Large quantities of antioxidant rich fruits & veggies; 6 or more servings  - Vegetables can be eaten raw or lightly drizzled with oil and cooked  - Vegetables common to the traditional Mediterranean Diet include: artichokes, arugula, beets, broccoli, brussel sprouts, cabbage, carrots, celery, collard greens, cucumbers, eggplant, kale, leeks, lemons, lettuce, mushrooms, okra, onions, peas, peppers, potatoes, pumpkin, radishes, rutabaga, shallots, spinach, sweet potatoes, turnips, zucchini - Fruits common to the Mediterranean Diet include: apples, apricots, avocados, cherries, clementines, dates, figs, grapefruits, grapes, melons, nectarines, oranges, peaches, pears, pomegranates, strawberries, tangerines  Fats - Replace butter and margarine with healthy oils, such as olive oil, canola oil, and tahini  - Limit nuts to no more than a handful a day  - Nuts include walnuts, almonds, pecans, pistachios, pine nuts  - Limit or avoid candied, honey roasted or heavily salted nuts - Olives are central to the [employer] - can be eaten whole or used in a variety of dishes   Meats Protein - Limiting red meat: no more than a few times a month - When eating red meat: choose lean cuts and keep the portion to  the size of deck of cards - Eggs: approx. 0 to 4 times a week  - Fish and lean poultry: at least 2 a week  - Healthy protein sources include, chicken, [place], lean beef, lamb - Increase intake of seafood such as tuna, salmon, trout, mackerel, shrimp, scallops - Avoid or limit high fat processed meats such as sausage and bacon  Dairy - Include moderate amounts of low fat dairy products  - Focus on healthy dairy such as fat free yogurt, skim milk, low or reduced fat cheese - Limit dairy products higher in fat such as whole or 2% milk, cheese, ice cream  Alcohol - Moderate amounts of red wine is ok  - No more than 5 oz daily for women (all ages) and men older than age 32  - No more than 10 oz of wine daily for men younger than 39  Other - Limit sweets and other desserts  - Use herbs and spices instead of salt to flavor foods  - Herbs and spices common to the traditional Mediterranean Diet  include: basil, bay leaves, chives, cloves, cumin, fennel, garlic, lavender, marjoram, mint, oregano, parsley, pepper, rosemary, sage, savory, sumac, tarragon, thyme   It's not just a diet, it's a lifestyle:  The Mediterranean diet includes lifestyle factors typical of those in the region  Foods, drinks and meals are best eaten with others and savored Daily physical activity is important for overall good health This could be strenuous exercise like running and aerobics This could also be more leisurely activities such as walking, housework, yard-work, or taking the stairs Moderation is the [place]; a balanced and healthy diet accommodates most foods and drinks Consider portion sizes and frequency of consumption of certain foods   Meal Ideas & Options:  Breakfast:  Whole wheat toast or whole wheat English muffins with peanut butter & hard boiled egg Steel cut oats topped with apples & cinnamon and skim milk  Fresh fruit: banana, strawberries, melon, berries, peaches  Smoothies: strawberries, bananas, greek  yogurt, peanut butter Low fat greek yogurt with blueberries and granola  Egg white omelet with spinach and mushrooms Breakfast couscous: whole wheat couscous, apricots, skim milk, cranberries  Sandwiches:  Hummus and grilled vegetables (peppers, zucchini, squash) on whole wheat bread   Grilled chicken on whole wheat pita with lettuce, tomatoes, cucumbers or tzatziki  [place] salad on whole wheat bread: tuna salad made with greek yogurt, olives, red peppers, capers, green onions Garlic rosemary lamb pita: lamb sauted with garlic, rosemary, salt & pepper; add lettuce, cucumber, greek yogurt to pita - flavor with lemon juice and black pepper  Seafood:  Mediterranean grilled salmon, seasoned with garlic, basil, parsley, lemon juice and black pepper Shrimp, lemon, and spinach whole-grain pasta salad made with low fat greek yogurt  Seared scallops with lemon orzo  Seared tuna steaks seasoned salt, pepper, coriander topped with tomato mixture of olives, tomatoes, olive oil, minced garlic, parsley, green onions and cappers  Meats:  Herbed greek chicken salad with kalamata olives, cucumber, feta  Red bell peppers stuffed with spinach, bulgur, lean ground beef (or lentils) & topped with feta   Kebabs: skewers of chicken, tomatoes, onions, zucchini, squash  [place] burgers: made with red onions, mint, dill, lemon juice, feta cheese topped with roasted red peppers Vegetarian Cucumber salad: cucumbers, artichoke hearts, celery, red onion, feta cheese, tossed in olive oil & lemon juice  Hummus and whole grain pita points with a greek salad (lettuce, tomato, feta, olives, cucumbers, red onion) Lentil soup with celery, carrots made with vegetable broth, garlic, salt and pepper  Tabouli salad: parsley, bulgur, mint, scallions, cucumbers, tomato, radishes, lemon juice, olive oil, salt and pepper.

## 2021-06-25 ENCOUNTER — Other Ambulatory Visit: Payer: Self-pay

## 2021-06-28 DIAGNOSIS — F439 Reaction to severe stress, unspecified: Secondary | ICD-10-CM | POA: Diagnosis not present

## 2021-06-28 DIAGNOSIS — F419 Anxiety disorder, unspecified: Secondary | ICD-10-CM | POA: Diagnosis not present

## 2021-06-28 DIAGNOSIS — G4733 Obstructive sleep apnea (adult) (pediatric): Secondary | ICD-10-CM | POA: Diagnosis not present

## 2021-06-28 DIAGNOSIS — M810 Age-related osteoporosis without current pathological fracture: Secondary | ICD-10-CM | POA: Diagnosis not present

## 2021-06-28 DIAGNOSIS — M1990 Unspecified osteoarthritis, unspecified site: Secondary | ICD-10-CM | POA: Diagnosis not present

## 2021-06-28 DIAGNOSIS — I10 Essential (primary) hypertension: Secondary | ICD-10-CM | POA: Diagnosis not present

## 2021-06-28 DIAGNOSIS — K219 Gastro-esophageal reflux disease without esophagitis: Secondary | ICD-10-CM | POA: Diagnosis not present

## 2021-06-28 DIAGNOSIS — E039 Hypothyroidism, unspecified: Secondary | ICD-10-CM | POA: Diagnosis not present

## 2021-06-28 DIAGNOSIS — F32A Depression, unspecified: Secondary | ICD-10-CM | POA: Diagnosis not present

## 2021-08-16 DIAGNOSIS — R0981 Nasal congestion: Secondary | ICD-10-CM | POA: Diagnosis not present

## 2021-08-16 DIAGNOSIS — J40 Bronchitis, not specified as acute or chronic: Secondary | ICD-10-CM | POA: Diagnosis not present

## 2021-08-16 DIAGNOSIS — R519 Headache, unspecified: Secondary | ICD-10-CM | POA: Diagnosis not present

## 2021-08-16 DIAGNOSIS — R051 Acute cough: Secondary | ICD-10-CM | POA: Diagnosis not present

## 2021-08-16 DIAGNOSIS — Z20822 Contact with and (suspected) exposure to covid-19: Secondary | ICD-10-CM | POA: Diagnosis not present

## 2021-08-16 DIAGNOSIS — R5383 Other fatigue: Secondary | ICD-10-CM | POA: Diagnosis not present

## 2021-08-16 DIAGNOSIS — Z8616 Personal history of COVID-19: Secondary | ICD-10-CM | POA: Diagnosis not present

## 2021-08-16 DIAGNOSIS — Z20828 Contact with and (suspected) exposure to other viral communicable diseases: Secondary | ICD-10-CM | POA: Diagnosis not present

## 2021-10-06 DIAGNOSIS — L738 Other specified follicular disorders: Secondary | ICD-10-CM | POA: Diagnosis not present

## 2021-10-06 DIAGNOSIS — L820 Inflamed seborrheic keratosis: Secondary | ICD-10-CM | POA: Diagnosis not present

## 2021-10-06 DIAGNOSIS — L720 Epidermal cyst: Secondary | ICD-10-CM | POA: Diagnosis not present

## 2021-11-05 DIAGNOSIS — H04123 Dry eye syndrome of bilateral lacrimal glands: Secondary | ICD-10-CM | POA: Diagnosis not present

## 2021-11-05 DIAGNOSIS — H40032 Anatomical narrow angle, left eye: Secondary | ICD-10-CM | POA: Diagnosis not present

## 2021-11-05 DIAGNOSIS — Z961 Presence of intraocular lens: Secondary | ICD-10-CM | POA: Diagnosis not present

## 2021-11-05 DIAGNOSIS — H40013 Open angle with borderline findings, low risk, bilateral: Secondary | ICD-10-CM | POA: Diagnosis not present

## 2021-11-24 ENCOUNTER — Other Ambulatory Visit (HOSPITAL_COMMUNITY): Payer: Self-pay | Admitting: *Deleted

## 2021-11-25 ENCOUNTER — Encounter (HOSPITAL_COMMUNITY): Payer: Medicare PPO

## 2021-11-25 ENCOUNTER — Inpatient Hospital Stay (HOSPITAL_COMMUNITY): Admission: RE | Admit: 2021-11-25 | Payer: Medicare PPO | Source: Ambulatory Visit

## 2021-11-26 ENCOUNTER — Ambulatory Visit (HOSPITAL_COMMUNITY)
Admission: RE | Admit: 2021-11-26 | Discharge: 2021-11-26 | Disposition: A | Discharge status: Home / Self Care | Payer: Medicare PPO | Source: Ambulatory Visit | Attending: Hematology & Oncology | Admitting: Hematology & Oncology

## 2021-11-26 ENCOUNTER — Other Ambulatory Visit: Payer: Self-pay

## 2021-11-26 DIAGNOSIS — M810 Age-related osteoporosis without current pathological fracture: Secondary | ICD-10-CM | POA: Insufficient documentation

## 2021-11-26 MED ORDER — DENOSUMAB 60 MG/ML ~~LOC~~ SOSY
60.0000 mg | PREFILLED_SYRINGE | Freq: Once | SUBCUTANEOUS | Status: AC
  Administered 2021-11-26: 60 mg via SUBCUTANEOUS

## 2021-11-26 MED ORDER — DENOSUMAB 60 MG/ML ~~LOC~~ SOSY
PREFILLED_SYRINGE | SUBCUTANEOUS | Status: AC
  Filled 2021-11-26: qty 1

## 2022-02-15 DIAGNOSIS — R5383 Other fatigue: Secondary | ICD-10-CM | POA: Diagnosis not present

## 2022-02-15 DIAGNOSIS — F419 Anxiety disorder, unspecified: Secondary | ICD-10-CM | POA: Diagnosis not present

## 2022-02-15 DIAGNOSIS — I10 Essential (primary) hypertension: Secondary | ICD-10-CM | POA: Diagnosis not present

## 2022-02-15 DIAGNOSIS — Z0000 Encounter for general adult medical examination without abnormal findings: Secondary | ICD-10-CM | POA: Diagnosis not present

## 2022-02-15 DIAGNOSIS — E559 Vitamin D deficiency, unspecified: Secondary | ICD-10-CM | POA: Diagnosis not present

## 2022-02-15 DIAGNOSIS — E039 Hypothyroidism, unspecified: Secondary | ICD-10-CM | POA: Diagnosis not present

## 2022-02-15 DIAGNOSIS — R739 Hyperglycemia, unspecified: Secondary | ICD-10-CM | POA: Diagnosis not present

## 2022-02-22 DIAGNOSIS — R82998 Other abnormal findings in urine: Secondary | ICD-10-CM | POA: Diagnosis not present

## 2022-02-22 DIAGNOSIS — M170 Bilateral primary osteoarthritis of knee: Secondary | ICD-10-CM | POA: Diagnosis not present

## 2022-02-22 DIAGNOSIS — I10 Essential (primary) hypertension: Secondary | ICD-10-CM | POA: Diagnosis not present

## 2022-02-22 DIAGNOSIS — K219 Gastro-esophageal reflux disease without esophagitis: Secondary | ICD-10-CM | POA: Diagnosis not present

## 2022-02-22 DIAGNOSIS — J302 Other seasonal allergic rhinitis: Secondary | ICD-10-CM | POA: Diagnosis not present

## 2022-02-22 DIAGNOSIS — E039 Hypothyroidism, unspecified: Secondary | ICD-10-CM | POA: Diagnosis not present

## 2022-02-22 DIAGNOSIS — R739 Hyperglycemia, unspecified: Secondary | ICD-10-CM | POA: Diagnosis not present

## 2022-02-22 DIAGNOSIS — G4733 Obstructive sleep apnea (adult) (pediatric): Secondary | ICD-10-CM | POA: Diagnosis not present

## 2022-02-22 DIAGNOSIS — M810 Age-related osteoporosis without current pathological fracture: Secondary | ICD-10-CM | POA: Diagnosis not present

## 2022-02-22 DIAGNOSIS — Z0000 Encounter for general adult medical examination without abnormal findings: Secondary | ICD-10-CM | POA: Diagnosis not present

## 2022-03-17 ENCOUNTER — Ambulatory Visit: Payer: Medicare PPO | Admitting: Cardiology

## 2022-03-17 ENCOUNTER — Encounter: Payer: Self-pay | Admitting: Cardiology

## 2022-03-17 VITALS — BP 113/70 | HR 68 | Temp 97.6°F | Resp 16 | Ht 61.0 in | Wt 144.2 lb

## 2022-03-17 DIAGNOSIS — G4739 Other sleep apnea: Secondary | ICD-10-CM

## 2022-03-17 DIAGNOSIS — R931 Abnormal findings on diagnostic imaging of heart and coronary circulation: Secondary | ICD-10-CM

## 2022-03-17 DIAGNOSIS — R0602 Shortness of breath: Secondary | ICD-10-CM

## 2022-03-17 DIAGNOSIS — G4731 Primary central sleep apnea: Secondary | ICD-10-CM

## 2022-03-17 DIAGNOSIS — E7800 Pure hypercholesterolemia, unspecified: Secondary | ICD-10-CM

## 2022-03-17 DIAGNOSIS — Z1231 Encounter for screening mammogram for malignant neoplasm of breast: Secondary | ICD-10-CM | POA: Diagnosis not present

## 2022-03-17 NOTE — Progress Notes (Signed)
Primary Physician/Referring:  Donnajean Lopes, MD  Patient ID: Andrea Henderson, female    DOB: 1941-09-08, 81 y.o.   MRN: [id number]  Chief Complaint  Patient presents with   Shortness of Breath   New Patient (Initial Visit)   HPI:    Andrea Henderson  is a 81 y.o. Caucasian female who I had seen in 2018 for frequent PVCs, low normal LVEF, an episode of syncope versus a fall, was recommended loop recorder implantation by Dr. Cristopher Peru, patient has had no follow-up however has not had any recurrence of syncope.  She now presents to establish care due to dyspnea.   She continues to exercise very regularly in the gym along with her teammates and feels that she is doing the best intergroup.  She has no exertional dyspnea.  But states that she feels like taking a deep breath when she is sitting or laying down.  Hence difficult to make out her symptoms are related to cardiac or pulmonary issues, previous pulmonary evaluation was negative.  No chest pain, no PND or orthopnea, no leg edema.  Past Medical History:  Diagnosis Date   Cataracts, both eyes 2021   COVID    Depression    Glaucoma    Glaucoma    History of COVID-19 10/13/2020   HTN (hypertension)    Hypothyroid 20 yrs ago   Vitamin D deficiency disease 11/2006   Past Surgical History:  Procedure Laterality Date   CATARACT EXTRACTION  10/13/2020   GLAUCOMA SURGERY     REFRACTIVE SURGERY Bilateral 12/2006   thumb repair     Family History  Problem Relation Age of Onset   Stroke Mother        CVA   Mitral valve prolapse Mother    AAA (abdominal aortic aneurysm) Father    Mitral valve prolapse Sister    Atrial fibrillation Sister    Non-Hodgkin's lymphoma Sister    Breast cancer Other 50       neice    Social History   Tobacco Use   Smoking status: Never   Smokeless tobacco: Never  Substance Use Topics   Alcohol use: Yes    Comment: occ beer   Marital Status: Married  ROS  Review of Systems  Cardiovascular:   Negative for chest pain, dyspnea on exertion and leg swelling.   Objective  Blood pressure 113/70, pulse 68, temperature 97.6 F (36.4 C), temperature source Temporal, resp. rate 16, height 5' 1"  (1.549 m), weight 144 lb 3.2 oz (65.4 kg), last menstrual period 09/12/2000, SpO2 94 %. Body mass index is 27.25 kg/m.     03/17/2022   11:21 AM 11/26/2021    1:19 PM 05/31/2021    2:09 PM  Vitals with BMI  Height 5' 1"   5' 1"   Weight 144 lbs 3 oz  144 lbs  BMI 27.78  24.23  Systolic 536 144 315  Diastolic 70 72 68  Pulse 68 59 58    Physical Exam Neck:     Vascular: No JVD.  Cardiovascular:     Rate and Rhythm: Normal rate and regular rhythm.     Pulses: Intact distal pulses.     Heart sounds: S1 normal and S2 normal. Murmur heard.     Early systolic murmur is present with a grade of 2/6 at the apex radiating to the apex.     No gallop.  Pulmonary:     Effort: Pulmonary effort is normal.     Breath sounds:  Normal breath sounds.  Abdominal:     General: Bowel sounds are normal.     Palpations: Abdomen is soft.  Musculoskeletal:     Right lower leg: No edema.     Left lower leg: No edema.     Medications and allergies   Allergies  Allergen Reactions   Codeine Other (See Comments)    Unknown reaction   Sulfa Antibiotics Rash    Childhood reaction     Medication list after today's encounter   Current Outpatient Medications:    BIOTIN PO, Take 1 tablet by mouth daily., Disp: , Rfl:    CALCIUM PO, Take by mouth., Disp: , Rfl:    denosumab (PROLIA) 60 MG/ML SOSY injection, Inject 60 mg into the skin every 6 (six) months., Disp: , Rfl:    fish oil-omega-3 fatty acids 1000 MG capsule, Take 1 g by mouth daily. , Disp: , Rfl:    fluticasone (FLONASE) 50 MCG/ACT nasal spray, Place 2 sprays into both nostrils 2 (two) times daily. , Disp: , Rfl:    Ginger, Zingiber officinalis, (GINGER ROOT PO), Take 1 capsule by mouth daily in the afternoon., Disp: , Rfl:    glucosamine-chondroitin  500-400 MG tablet, Take 1 tablet by mouth 3 (three) times daily., Disp: , Rfl:    indapamide (LOZOL) 1.25 MG tablet, Take 1.25 mg by mouth 2 (two) times a week. On Saturday and Wednesday, Disp: , Rfl:    levothyroxine (SYNTHROID, LEVOTHROID) 88 MCG tablet, Take 88 mcg by mouth daily.  , Disp: , Rfl:    loratadine (CLARITIN) 10 MG tablet, Take 10 mg by mouth daily., Disp: , Rfl:    Magnesium 400 MG TABS, Take 1 tablet by mouth daily., Disp: , Rfl:    Multiple Vitamins-Minerals (MULTIVITAMIN PO), Take by mouth., Disp: , Rfl:    Multiple Vitamins-Minerals (ONE-A-DAY VITACRAVES IMMUNITY PO), Take by mouth., Disp: , Rfl:    pantoprazole (PROTONIX) 40 MG tablet, Take 40 mg by mouth daily., Disp: , Rfl:    rosuvastatin (CRESTOR) 10 MG tablet, Take 10 mg by mouth daily., Disp: , Rfl:    sertraline (ZOLOFT) 50 MG tablet, Take 50 mg by mouth daily., Disp: , Rfl:    TURMERIC PO, Take 1 capsule by mouth daily., Disp: , Rfl:    Vitamin D, Ergocalciferol, (DRISDOL) 50000 UNITS CAPS, Take 50,000 Units by mouth every 14 (fourteen) days. , Disp: , Rfl:   Laboratory examination:   External labs:   Cholesterol, total 219.000 m 01/25/2021 HDL 50.000 mg 01/25/2021 LDL 146.000 m 01/25/2021 Triglycerides 113.000 m 01/25/2021  A1C 5.400 % 01/25/2021  Labs 01/26/2021:  BUN 12, creatinine 0.9, EGFR 60 mill, potassium 3.8, LFTs normal.  Hb 13.6/HCT 40.6, platelets 206.  Normal indicis.  TSH normal at 2.09.  Free T4 normal at 1.3.  Vitamin D 65.1.  A1c 5.4%.  Radiology:    Cardiac Studies:   Treadmill stress test   [03/03/2017]:  Indication: SoB, Syncope Resting EKG demonstrates NSR. The patient exercised according to Bruce Protocol, Total time recorded 6:15 min achieving max heart rate of 135 which was 91% of THR for age and 7.38 METS of work. Stress terminated due to fatigue and THR (>85% MPHR)/MPHR met. Normal BP response. There was no ST-T changes of ischemia with exercise stress test. There were no  significant arrhythmias, rare PVC. Normal BP response. Rec: No e/o ischemia by GXT. Exercise tolerence is low normal . Continue Preventive therapy.  Echocardiogram 0 02/28/2017: Normal LV size, mild  decrease in LV systolic function with global hypokinesis, EF estimated at and calculated at 50%.  Grade 2 diastolic dysfunction.  PVCs noted during exam. Left atrial cavity is mild to moderately dilated in four-chamber views. Right atrial cavity is mildly dilated. Mild to moderate mitral regurgitation. Mild to moderate tricuspid regurgitation.  PA systolic pressure estimated at 31 mmHg. IVC is dilated with respiratory variation.  Split night sleep study 09/18/2018: 1. REM accentuated, COMPLEX Sleep Apnea, independent of position.  2. Prolonged hypoxemia and cardiac irregular beats.  3. Snoring  4. Non-specific abnormal EKG   RECOMMENDATIONS: Advise full-night, attended, CPAP titration  study to optimize therapy. This complex apnea with hypoxemia  should not be treated by auto CPAP and can change to central  dominant apnea under PAP therapy. This patient may need oxygen  supplementation.   EKG:   EKG 03/17/2022: Normal sinus rhythm at rate of 63 beats minute, left atrial enlargement, low voltage complexes.  Otherwise normal EKG.    Assessment     ICD-10-CM   1. Shortness of breath  R06.02 EKG 12-Lead    PCV ECHOCARDIOGRAM COMPLETE    2. 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 Hypercholesteremia  E78.00        Orders Placed This Encounter  Procedures   Ambulatory referral to Sleep Studies    Referral Priority:   Routine    Referral Type:   Consultation    Referral Reason:   Specialty Services Required    Number of Visits Requested:   1   EKG 12-Lead   PCV ECHOCARDIOGRAM COMPLETE    Standing Status:   Future    Standing Expiration Date:   03/18/2023    No orders of the defined types were  placed in this encounter.   There are no discontinued medications.   Recommendations:   Alsie Younes is a 81 y.o. Caucasian female who I had seen in 2018 for frequent PVCs, low normal LVEF, an episode of syncope versus a fall, was recommended loop recorder implantation by Dr. Cristopher Peru, patient has had no follow-up however has not had any recurrence of syncope.  She now presents to establish care due to dyspnea.  Symptoms are very atypical.  She continues to exercise very regularly in the gym along with her teammates and feels that she is doing the best intergroup.  She has no exertional dyspnea.  Hence difficult to make out her symptoms are related to cardiac or pulmonary issues, previous pulmonary evaluation was negative.  We will repeat echocardiogram to reevaluate her LV systolic function.  She also had mild to moderate MR and mild TR, we will follow-up on this as well.  If her LVEF is stable and normal, no further evaluation is indicated from cardiac standpoint otherwise if her EF is down, she may need extended EKG monitoring to evaluate for PVC burden as previously she had frequent PVCs.  She remains asymptomatic with regard to these PVCs.  Today the EKG does not reveal any other significant arrhythmias.  EKG is essentially normal.  She has recently started on Lipitor for hypercholesterolemia.  Hence continued primary prevention is indicated.  She does not need a stress testing as she remains asymptomatic and continues to exercise regularly.  With regard to sleep study, she has had complex sleep apnea.  I have made a referral back to Dr. Brett Fairy, her symptoms may also be  related to this.  Office visit in 6 weeks to close the loop.    Adrian Prows, MD, [facility] 03/17/2022, 12:44 PM Office: [phone]

## 2022-03-18 ENCOUNTER — Encounter: Payer: Self-pay | Admitting: Obstetrics and Gynecology

## 2022-04-21 ENCOUNTER — Ambulatory Visit: Payer: Medicare PPO

## 2022-04-21 DIAGNOSIS — R0602 Shortness of breath: Secondary | ICD-10-CM

## 2022-04-28 ENCOUNTER — Ambulatory Visit: Payer: Medicare PPO | Admitting: Cardiology

## 2022-05-02 DIAGNOSIS — L814 Other melanin hyperpigmentation: Secondary | ICD-10-CM | POA: Diagnosis not present

## 2022-05-02 DIAGNOSIS — L820 Inflamed seborrheic keratosis: Secondary | ICD-10-CM | POA: Diagnosis not present

## 2022-05-02 DIAGNOSIS — L821 Other seborrheic keratosis: Secondary | ICD-10-CM | POA: Diagnosis not present

## 2022-05-04 ENCOUNTER — Encounter: Payer: Self-pay | Admitting: Neurology

## 2022-05-04 ENCOUNTER — Ambulatory Visit (INDEPENDENT_AMBULATORY_CARE_PROVIDER_SITE_OTHER): Payer: Medicare PPO | Admitting: Neurology

## 2022-05-04 VITALS — BP 122/69 | HR 67 | Ht 61.0 in | Wt 145.5 lb

## 2022-05-04 DIAGNOSIS — G4731 Primary central sleep apnea: Secondary | ICD-10-CM | POA: Diagnosis not present

## 2022-05-04 DIAGNOSIS — G4734 Idiopathic sleep related nonobstructive alveolar hypoventilation: Secondary | ICD-10-CM

## 2022-05-04 DIAGNOSIS — G4733 Obstructive sleep apnea (adult) (pediatric): Secondary | ICD-10-CM | POA: Diagnosis not present

## 2022-05-04 DIAGNOSIS — I499 Cardiac arrhythmia, unspecified: Secondary | ICD-10-CM | POA: Diagnosis not present

## 2022-05-04 DIAGNOSIS — K219 Gastro-esophageal reflux disease without esophagitis: Secondary | ICD-10-CM

## 2022-05-04 DIAGNOSIS — I272 Pulmonary hypertension, unspecified: Secondary | ICD-10-CM | POA: Diagnosis not present

## 2022-05-04 NOTE — Progress Notes (Addendum)
SLEEP MEDICINE CLINIC   Provider:  Melvyn Novas, MD   Primary Care Physician:  Garlan Fillers, MD   Referring Provider: Yates Decamp, MD    Chief Complaint  Patient presents with   New Patient (Initial Visit)    pt with husband, rm 10. pt snores in sleep. she had been evaluated by ENT and there was no concern. pt states she sleeps good and gets about of 8 hours of sleep a night. pt states used to get tired during the day -improved after she started taking magnesium. No RLS.    Interval History :  Andrea Henderson is a 81 y.o. female patient seen here on 05-04-2022 upon a new referral from [place]. Eloise Harman and [place] for a sleep consultation.  New patient due to interval period of 3 years.  New Consult on  05-04-2022: Re-referral for interrupted sleep apnea work up- Was not able to continue work up in spring 2020 due to [employer] closure . She has had COVID 3x and having SOB. 10-2018 she was dx with influenza, and laterf ound to have had Covid.  She had since been vaccinated and boostered. She never had her CPAP titration study and was not placed on therapy. PSG AHI was  under 10/h.  Here to see if she still should have CPAP or not. She has been seen for pulmonary HTN and CAD by Dr. Jacinto Halim, Dr Ladona Ridgel . Has the same weight as 3 years ago.  She retired from Agricultural consultant.    Dr Jacinto Halim wrote 03-17-22; Andrea Henderson  is a 81 y.o. Caucasian female who I had seen in 2018 for frequent PVCs, low normal LVEF, an episode of syncope versus a fall, was recommended loop recorder implantation by Dr. Lewayne Bunting, patient has had no follow-up however has not had any recurrence of syncope. She now presents to establish care due to chronic dyspnea.   She continues to exercise very regularly in the gym along with her teammates and feels that she is doing the best intergroup.  She has no exertional dyspnea.  But states that she feels like taking a deep breath when she is sitting or laying down.  Hence difficult to make out her  symptoms are related to cardiac or pulmonary issues, previous pulmonary evaluation was negative.   CD Interval history : 11-07-2018, patient of Dr. Verl Dicker and Dr. Silvano Rusk. The patient does not have COPD she told me and sleeps no in a recliner at home.  We discussed the PSG result of hypoxemia, mild , but complex sleep apnea (09-18-2018). She snores. She was surprised about her long sleep latency while in the lab, and the 106 minutes of hypoxemia. She is again chronically coughing and reports her hoarseness, coughing and irritated larynx are due to GERD, not apnea. She ordered an adjustable bed to treat the GERD and help her husband to breathe, he has CoPD , he goes to the recliner at night.  Treatment of mild apnea is optional. She will give the bed a month and see if she sleeps and breathes better.     HPI:  Andrea Henderson is a 81 y.o. female patient seen here on 08-06-2018 in a referral from Dr. Jacinto Halim for a sleep consultation.  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  Andrea Henderson reports that she feels she gets good quality sleep, she regularly gets sleep but she has an increasing level of fatigue.  She does not report an irresistible urge  to go to sleep, sleep attacks, waking up choking or having shortness of breath, but her husband reports that she is snoring. She endorsed a fatigue severity score at 26/ 63 points, the Epworth sleepiness score at 8/ 24/  She exercises regularly, and when active has not fatigue.    Chief complaint according to patient : None   Sleep habits are as follows: Dinnertime varies greatly:as early as 5 PM as late as 8:30 PM, and mostly between 6 and 8 PM.  She would be able to go to bed and sleep as early as 10 PM but she usually stays up and goes to bed with her husband at 11 PM. Neither one has trouble initiating sleep, the bedroom is cool, quiet and dark.  She prefers to sleep on her side she is using a firm flat pillow for neck support.  The  bed is not adjustable.  She carries a diagnosis of gastroesophageal reflux disease-acid reflux but it has not woken her up nor been an impediment to sleep. She usually sleeps through until her first bathroom wake at 4 AM this is usually the only one.  She can then go back to sleep 3 or 4 hours.  Rise time in the morning is about 8 AM. Andrea Henderson used to be a more restless and active sleeper and often has moved towards the recliner to sleep there.  He also has COPD.  Sleep medical history: The patient saw Dr. 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  She did not opt for surgery.  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  Colon polyps, osteopenia, left knee osteoarthritis,  Cardiology :she is followed by Dr. Jacinto Halim.   She has moderate mitral regurgitation tricuspid regurgitation patient with pulmonary hypertension her PA systolic pressure was mentioned at 31.   She had bilateral atrial enlargement related to the valvular disease, left ventricular EF 50%. A 10-2016 fall with ankle fracture- let to cardio work up.  PVC- Dr Ladona Ridgel- patient declined loop.   Social history: Andrea Henderson social history she is a retired Runner, broadcasting/film/video, she also is very involved in her 34 year old grandson's life, he is at [employer] and lives with grandparents. One cat and one dog. He son died in 09-08-19. She has worked through grief and depression.    She used to work at [employer] .   No tobacco use no alcohol use.  Caffeine: caffeine free  iced tea and  diet coke  soda, drinks coffee 50% decaff 1-2 cups a day   Review of Systems: Out of a complete 14 system review, the  patient complains of only the following symptoms, and all other reviewed systems are negative.  How likely are you to doze in the following situations: 0 = not likely, 1 = slight chance, 2 = moderate chance, 3 = high chance  Sitting and Reading? Watching Television? Sitting inactive in a public place (theater or meeting)? Lying down in the afternoon when circumstances permit? Sitting and talking to someone? Sitting quietly after lunch without alcohol? In a car, while stopped for a few minutes in traffic? As a passenger in a car for an hour  without a break?  Total = 5/ 24   Snoring ( mild ) , PVCs, dry mouth, no headaches, some times light headed.   Epworth Sleepiness Score: at 5 , down from-10/ 24  ,  Fatigue severity score 26/ 63 points   , depression score :   Social History   Socioeconomic History   Marital status: Married    Spouse name: Not on file   Number of children: Not on file   Years of education: Not on file   Highest education level: Not on file  Occupational History   Not on file  Social Needs   Financial resource strain: Not on file   Food insecurity:    Worry: Not on file    Inability: Not on file   Transportation needs:    Medical: Not on file    Non-medical: Not on file  Tobacco Use   Smoking status: Passive Smoke Exposure - Never Smoker   Smokeless tobacco: Never Used  Substance and Sexual Activity   Alcohol use: No    Comment: rare   Drug use: No   Sexual activity: Not Currently    Partners: Male    Birth control/protection: Post-menopausal  Lifestyle   Physical activity:    Days per week: 5 days a week gym and Yoga.     Minutes per session: Not on file   Stress: Not on file  Relationships                                                          Other Topics Concern   Not on file  Social History Narrative   Not on file    Family History  Problem Relation Age of Onset   Stroke Mother        CVA   Mitral valve prolapse  Mother    AAA (abdominal aortic aneurysm) Father    Mitral valve prolapse Sister    Atrial fibrillation Sister    Non-Hodgkin's lymphoma Sister    Breast cancer Other 56       neice    Past Medical History:  Diagnosis Date   Cataracts, both eyes 2021   COVID    Depression    Glaucoma    Glaucoma    History of COVID-19 10/13/2020   HTN (hypertension)    Hypothyroid 20 yrs ago   Vitamin D deficiency disease 11/2006    Past Surgical History:  Procedure Laterality Date   CATARACT EXTRACTION  10/13/2020   GLAUCOMA SURGERY     REFRACTIVE SURGERY Bilateral 12/2006   thumb repair      Current Outpatient Medications  Medication Sig Dispense Refill   BIOTIN PO Take 1 tablet by mouth daily.     CALCIUM PO Take by mouth.     denosumab (PROLIA) 60 MG/ML SOSY injection Inject 60 mg into the skin every 6 (six) months.     fish oil-omega-3 fatty acids 1000 MG capsule Take 1 g by mouth daily.      fluticasone (FLONASE) 50 MCG/ACT nasal spray Place 2 sprays into both nostrils 2 (two) times daily.      Ginger, Zingiber officinalis, (GINGER ROOT PO) Take 1 capsule by mouth daily in the afternoon.     glucosamine-chondroitin 500-400 MG tablet Take 1 tablet by mouth 3 (three)  times daily.     indapamide (LOZOL) 1.25 MG tablet Take 1.25 mg by mouth 2 (two) times a week. On Saturday and Wednesday     levothyroxine (SYNTHROID, LEVOTHROID) 88 MCG tablet Take 88 mcg by mouth daily.       loratadine (CLARITIN) 10 MG tablet Take 10 mg by mouth daily.     Magnesium 400 MG TABS Take 1 tablet by mouth daily.     Multiple Vitamins-Minerals (MULTIVITAMIN PO) Take by mouth.     Multiple Vitamins-Minerals (ONE-A-DAY VITACRAVES IMMUNITY PO) Take by mouth.     pantoprazole (PROTONIX) 40 MG tablet Take 40 mg by mouth daily.     sertraline (ZOLOFT) 50 MG tablet Take 50 mg by mouth daily.     TURMERIC PO Take 1 capsule by mouth daily.     Vitamin D, Ergocalciferol, (DRISDOL) 50000 UNITS CAPS Take 50,000 Units  by mouth every 14 (fourteen) days.      No current facility-administered medications for this visit.    Allergies as of 05/04/2022 - Review Complete 05/04/2022  Allergen Reaction Noted   Codeine Other (See Comments) 09/04/2011   Sulfa antibiotics Rash 09/04/2011    Vitals: BP 122/69   Pulse 67   Ht 5\' 1"  (1.549 m)   Wt 145 lb 8 oz (66 kg)   LMP 09/12/2000   BMI 27.49 kg/m  Last Weight:  Wt Readings from Last 1 Encounters:  05/04/22 145 lb 8 oz (66 kg)   WUJ:WJXBBMI:Body mass index is 27.49 kg/m.     Last Height:   Ht Readings from Last 1 Encounters:  05/04/22 5\' 1"  (1.549 m)    Physical exam:  General: The patient is awake, alert and appears not in acute distress. The patient is well groomed. Head: Normocephalic, atraumatic. Neck is supple. Mallampati 3. Bony palata spur.  neck circumference:13. 5 " . Nasal airflow patent.  Cardiovascular: Regular rate and rhythm with every 6 th beat skipping. No carotid bruit, and no distended neck veins. Respiratory: Lungs are clear to auscultation.  Skin:  Without evidence of edema, or rash Trunk: BMI is 27.2. The patient's posture is erect.  Neurologic exam :The patient is awake and alert, oriented to place and time.   Attention span & concentration ability appears normal.  Speech is fluent  with dysphonia .  Mood and affect are appropriate.  Cranial nerves: taste and smell intact. Pupils are equal and briskly reactive to light.  Facial sensation intact to fine touch.  Facial motor strength is symmetric and tongue and uvula move midline. Shoulder shrug was symmetrical.  Motor exam: Normal tone, muscle bulk and symmetric strength in all extremities. Sensory:  Deferred.  DTR: trace only.   The patient was advised of the nature of the diagnosed disorder- the treatment options and the risks for general health and wellness arising from not treating the condition.  I spent more than 35 minutes of face to face time with the patient. Greater  than 50% of time was spent in counseling and coordination of care. We have discussed the diagnosis and differential and I answered the patient's questions.    ASSESSMENT : 1) mild sleep apnea 2020, because of cardiac factors I wanted to pursue a CPAP trial.  In the interval has developed hoarse voice, GERD, coughing , had Covid 3 times and was SOB for months. She still is more short of breath than she used to be. Her total apnea-hypopnea index was 8.2/h but her REM sleep dependent AHI was  23.7/h.  She slept 118 minutes in supine and 254 minutes in nonsupine which did not make a difference.  Her oxygen saturation had a nadir of 78% time spent below 89% saturation equaled 106 minutes.  This is why she should be reevaluated.  She had REM sleep accentuated complex sleep apnea independent of position was prolonged hypoxemia.  She had some nonspecific abnormalities on her EKG during that night.  Plan:  Treatment plan and additional workup :  1) I will order a new sleep test, since there is a lot of interval history. I would be happy with a HST. Based on that will be able to start CPAP if indicated.     Patient will call us back in 3-4 weeks and tell us if she is coming back for an attended CPAP titration due to hypoxemia, mild complex apnea, in the meantime she will try to control the sinusitis, untreated GERD and cough.    Melvyn Novas, MD 05/04/2022, 11:39 AM  Certified in Neurology by ABPN Certified in Sleep Medicine by [facility] [address], [address] [place], [place] [address]

## 2022-05-04 NOTE — Patient Instructions (Signed)
Screening for Sleep Apnea  Sleep apnea is a condition in which breathing pauses or becomes shallow during sleep. Sleep apnea screening is a test to determine if you are at risk for sleep apnea. The test includes a series of questions. It will only takes a few minutes. Your health care provider may ask you to have this test in preparation for surgery or as part of a physical exam. What are the symptoms of sleep apnea? Common symptoms of sleep apnea include: Snoring. Waking up often at night. Daytime sleepiness. Pauses in breathing. Choking or gasping during sleep. Irritability. Forgetfulness. Trouble thinking clearly. Depression. Personality changes. Most people with sleep apnea do not know that they have it. What are the advantages of sleep apnea screening? Getting screened for sleep apnea can help: Ensure your safety. It is important for your health care providers to know whether or not you have sleep apnea, especially if you are having surgery or have other long-term (chronic) health conditions. Improve your health and allow you to get a better night's rest. Restful sleep can help you: Have more energy. Lose weight. Improve high blood pressure. Improve diabetes management. Prevent stroke. Prevent car accidents. What happens during the screening? Screening usually includes being asked a list of questions about your sleep quality. Some questions you may be asked include: Do you snore? Is your sleep restless? Do you have daytime sleepiness? Has a partner or spouse told you that you stop breathing during sleep? Have you had trouble concentrating or memory loss? What is your age? What is your neck circumference? To measure your neck, keep your back straight and gently wrap the tape measure around your neck. Put the tape measure at the middle of your neck, between your chin and collarbone. What is your sex assigned at birth? Do you have or are you being treated for high blood  pressure? If your screening test is positive, you are at risk for the condition. Further testing may be needed to confirm a diagnosis of sleep apnea. Where to find more information You can find screening tools online or at your health care clinic. For more information about sleep apnea screening and healthy sleep, visit these websites: Centers for Disease Control and Prevention: www.cdc.gov American Sleep Apnea Association: www.sleepapnea.org Contact a health care provider if: You think that you may have sleep apnea. Summary Sleep apnea screening can help determine if you are at risk for sleep apnea. It is important for your health care providers to know whether or not you have sleep apnea, especially if you are having surgery or have other chronic health conditions. You may be asked to take a screening test for sleep apnea in preparation for surgery or as part of a physical exam. This information is not intended to replace advice given to you by your health care provider. Make sure you discuss any questions you have with your health care provider. Document Revised: 08/07/2020 Document Reviewed: 08/07/2020 Elsevier Patient Education  2023 Elsevier Inc.  

## 2022-05-25 ENCOUNTER — Ambulatory Visit: Payer: Medicare PPO | Admitting: Cardiology

## 2022-05-25 ENCOUNTER — Encounter: Payer: Self-pay | Admitting: Cardiology

## 2022-05-25 VITALS — BP 117/77 | HR 72 | Temp 97.8°F | Resp 16 | Ht 61.0 in | Wt 145.6 lb

## 2022-05-25 DIAGNOSIS — G4731 Primary central sleep apnea: Secondary | ICD-10-CM | POA: Diagnosis not present

## 2022-05-25 DIAGNOSIS — R0602 Shortness of breath: Secondary | ICD-10-CM | POA: Diagnosis not present

## 2022-05-25 NOTE — Progress Notes (Signed)
Primary Physician/Referring:  Donnajean Lopes, MD  Patient ID: Andrea Henderson, female    DOB: 05/13/1941, 81 y.o.   MRN: [id number]  Chief Complaint  Patient presents with   Cardiomyopathy   Shortness of Breath   Follow-up    6 weeks   HPI:    Andrea Henderson  is a 81 y.o. Caucasian female who I had seen in 2018 for frequent PVCs, low normal LVEF, an episode of syncope versus a fall, has not had any recurrence of syncope, mild complex sleep apnea, reestablished with me 6 weeks ago for evaluation of dyspnea on exertion.  In the interval patient has developed GERD, coughing, and has had COVID 3 times.  She was also noted to have frequent PVCs.  Underwent echocardiogram and presents for follow-up.  She now presents for f/u of dyspnea.  Symptoms are very atypical.  She continues to exercise very regularly in the gym along with her teammates and feels that she is doing the best in her group. Previous pulmonary evaluation was negative. No chest pain, no PND or orthopnea, no leg edema.  Past Medical History:  Diagnosis Date   Cataracts, both eyes 2021   COVID    Depression    Glaucoma    Glaucoma    History of COVID-19 10/13/2020   HTN (hypertension)    Hypothyroid 20 yrs ago   Vitamin D deficiency disease 11/2006   Past Surgical History:  Procedure Laterality Date   CATARACT EXTRACTION  10/13/2020   GLAUCOMA SURGERY     REFRACTIVE SURGERY Bilateral 12/2006   thumb repair     Family History  Problem Relation Age of Onset   Heart disease Mother    Stroke Mother        CVA   Mitral valve prolapse Mother    AAA (abdominal aortic aneurysm) Father    Heart disease Sister    Mitral valve prolapse Sister    Atrial fibrillation Sister    Non-Hodgkin's lymphoma Sister    Breast cancer Other 72       neice    Social History   Tobacco Use   Smoking status: Never   Smokeless tobacco: Never  Substance Use Topics   Alcohol use: Yes    Comment: occ beer   Marital Status: Married  ROS   Review of Systems  Cardiovascular:  Negative for chest pain, dyspnea on exertion and leg swelling.   Objective  Blood pressure 117/77, pulse 72, temperature 97.8 F (36.6 C), temperature source Temporal, resp. rate 16, height _0  (1.549 m), weight 145 lb 9.6 oz (66 kg), last menstrual period 09/12/2000, SpO2 95 %. Body mass index is 27.51 kg/m.     05/25/2022    9:10 AM 05/04/2022   11:31 AM 03/17/2022   11:21 AM  Vitals with BMI  Height _1  _2  _3   Weight 145 lbs 10 oz 145 lbs 8 oz 144 lbs 3 oz  BMI 27.53 33.61 22.44  Systolic 975 300 511  Diastolic 77 69 70  Pulse 72 67 68    Physical Exam Neck:     Vascular: No JVD.  Cardiovascular:     Rate and Rhythm: Normal rate and regular rhythm.     Pulses: Intact distal pulses.     Heart sounds: S1 normal and S2 normal. A midsystolic click. Murmur heard.     Midsystolic murmur is present with a grade of 2/6 radiating to the apex.     No gallop.  Pulmonary:     Effort: Pulmonary effort is normal.     Breath sounds: Normal breath sounds.  Abdominal:     General: Bowel sounds are normal.     Palpations: Abdomen is soft.  Musculoskeletal:     Right lower leg: No edema.     Left lower leg: No edema.    Medications and allergies   Allergies  Allergen Reactions   Codeine Other (See Comments)    Unknown reaction   Sulfa Antibiotics Rash    Childhood reaction     Medication list after today's encounter   Current Outpatient Medications:    BIOTIN PO, Take 1 tablet by mouth daily., Disp: , Rfl:    CALCIUM PO, Take by mouth., Disp: , Rfl:    denosumab (PROLIA) 60 MG/ML SOSY injection, Inject 60 mg into the skin every 6 (six) months., Disp: , Rfl:    fish oil-omega-3 fatty acids 1000 MG capsule, Take 1 g by mouth daily. , Disp: , Rfl:    fluticasone (FLONASE) 50 MCG/ACT nasal spray, Place 2 sprays into both nostrils 2 (two) times daily. , Disp: , Rfl:    Ginger, Zingiber officinalis, (GINGER ROOT PO), Take 1 capsule by  mouth daily in the afternoon., Disp: , Rfl:    glucosamine-chondroitin 500-400 MG tablet, Take 1 tablet by mouth 3 (three) times daily., Disp: , Rfl:    indapamide (LOZOL) 1.25 MG tablet, Take 1.25 mg by mouth 2 (two) times a week. On Saturday and Wednesday, Disp: , Rfl:    levothyroxine (SYNTHROID, LEVOTHROID) 88 MCG tablet, Take 88 mcg by mouth daily.  , Disp: , Rfl:    loratadine (CLARITIN) 10 MG tablet, Take 10 mg by mouth daily., Disp: , Rfl:    Magnesium 400 MG TABS, Take 1 tablet by mouth daily., Disp: , Rfl:    Multiple Vitamins-Minerals (MULTIVITAMIN PO), Take by mouth., Disp: , Rfl:    Multiple Vitamins-Minerals (ONE-A-DAY VITACRAVES IMMUNITY PO), Take by mouth., Disp: , Rfl:    pantoprazole (PROTONIX) 40 MG tablet, Take 40 mg by mouth daily., Disp: , Rfl:    sertraline (ZOLOFT) 50 MG tablet, Take 50 mg by mouth daily., Disp: , Rfl:    TURMERIC PO, Take 1 capsule by mouth daily., Disp: , Rfl:    Vitamin D, Ergocalciferol, (DRISDOL) 50000 UNITS CAPS, Take 50,000 Units by mouth every 14 (fourteen) days. , Disp: , Rfl:   Laboratory examination:   External labs:   Cholesterol, total 219.000 m 01/25/2021 HDL 50.000 mg 01/25/2021 LDL 146.000 m 01/25/2021 Triglycerides 113.000 m 01/25/2021  A1C 5.400 % 01/25/2021  Labs 01/26/2021:  BUN 12, creatinine 0.9, EGFR 60 mill, potassium 3.8, LFTs normal.  Hb 13.6/HCT 40.6, platelets 206.  Normal indicis.  TSH normal at 2.09.  Free T4 normal at 1.3.  Vitamin D 65.1.  A1c 5.4%.  Radiology:    Cardiac Studies:   Treadmill stress test   [03/03/2017]:  Indication: SoB, Syncope Resting EKG demonstrates NSR. The patient exercised according to Bruce Protocol, Total time recorded 6:15 min achieving max heart rate of 135 which was 91% of THR for age and 7.38 METS of work. Stress terminated due to fatigue and THR (>85% MPHR)/MPHR met. Normal BP response. There was no ST-T changes of ischemia with exercise stress test. There were no significant  arrhythmias, rare PVC. Normal BP response. Rec: No e/o ischemia by GXT. Exercise tolerence is low normal . Continue Preventive therapy.  Split night sleep study 09/18/2018: 1. REM accentuated, COMPLEX  Sleep Apnea, independent of position.  2. Prolonged hypoxemia and cardiac irregular beats.  3. Snoring  4. Non-specific abnormal EKG   RECOMMENDATIONS: Advise full-night, attended, CPAP titration  study to optimize therapy. This complex apnea with hypoxemia  should not be treated by auto CPAP and can change to central  dominant apnea under PAP therapy. This patient may need oxygen supplementation.    PCV ECHOCARDIOGRAM COMPLETE 04/21/2022  Narrative Echocardiogram 04/22/2022: Normal LV systolic function with visual EF 60-65%. Left ventricle cavity is normal in size. Normal left ventricular wall thickness. Normal global wall motion. Doppler evidence of grade I (impaired) diastolic dysfunction, normal LAP. Calculated EF 64%. Structurally normal trileaflet aortic valve.  Mild (Grade I) aortic regurgitation. Structurally normal tricuspid valve with trace regurgitation. No evidence of pulmonary hypertension. Compared to 02/28/2017, mild right atrial enlargement, mild to moderate MR and TR and dilated IVC not present.    EKG:   EKG 03/17/2022: Normal sinus rhythm at rate of 63 beats minute, left atrial enlargement, low voltage complexes.  Otherwise normal EKG.    Assessment     ICD-10-CM   1. Shortness of breath  R06.02     2. 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   No orders of the defined types were placed in this encounter.   There are no discontinued medications.   Recommendations:   Andrea Henderson is a 81 y.o. Caucasian female who I had seen in 2018 for frequent PVCs, low normal LVEF, an episode of syncope versus a fall, has not had any recurrence of syncope, mild complex sleep apnea, reestablished with me 6 weeks ago  for evaluation of dyspnea on exertion.  In the interval patient has developed GERD, coughing, and has had COVID 3 times.  She was also noted to have frequent PVCs.  Underwent echocardiogram and presents for follow-up.  She now presents for f/u of dyspnea.  Symptoms are very atypical.  She continues to exercise very regularly in the gym along with her teammates and feels that she is doing the best in her group.  I personally reviewed the images of the echocardiogram, she has very intermittent mitral valve prolapse with mild mitral regurgitation.  Easy to miss this.  Clinically she does have MVP with mild systolic murmur at the apex.  LVEF been preserved, do not think her frequent PVCs are leading to her symptoms.  For now continued observation is indicated in the absence of any RV strain, pulmonary hypertension, preserved LVEF.  She has revisited her sleep disordered breathing, she will continue to follow-up with Dr. Brett Fairy regarding this.  Unless she has issues, I will see her back on a as needed basis.     Adrian Prows, MD, [facility] 05/25/2022, 9:25 AM Office: [phone]

## 2022-05-30 ENCOUNTER — Telehealth: Payer: Self-pay | Admitting: Neurology

## 2022-05-30 NOTE — Telephone Encounter (Signed)
[employer] pending uploaded notes on the portal  

## 2022-05-31 NOTE — Telephone Encounter (Signed)
LVM for pt to call back to schedule sleep study.  

## 2022-06-08 NOTE — Telephone Encounter (Signed)
[employer]- [employer] Josem Kaufmann: [phone] (exp. 05/30/22 to 08/28/22).  Patient is scheduled at [facility] for 07/18/22 at 9 pm.  Mailed packet to the patient.

## 2022-06-15 ENCOUNTER — Other Ambulatory Visit (HOSPITAL_COMMUNITY): Payer: Self-pay

## 2022-06-16 ENCOUNTER — Ambulatory Visit (HOSPITAL_COMMUNITY)
Admission: RE | Admit: 2022-06-16 | Discharge: 2022-06-16 | Disposition: A | Discharge status: Home / Self Care | Payer: Medicare PPO | Source: Ambulatory Visit | Attending: Internal Medicine | Admitting: Internal Medicine

## 2022-06-16 DIAGNOSIS — E039 Hypothyroidism, unspecified: Secondary | ICD-10-CM | POA: Diagnosis not present

## 2022-06-16 DIAGNOSIS — M810 Age-related osteoporosis without current pathological fracture: Secondary | ICD-10-CM | POA: Diagnosis not present

## 2022-06-16 DIAGNOSIS — Z23 Encounter for immunization: Secondary | ICD-10-CM | POA: Diagnosis not present

## 2022-06-16 DIAGNOSIS — I10 Essential (primary) hypertension: Secondary | ICD-10-CM | POA: Diagnosis not present

## 2022-06-16 DIAGNOSIS — R7989 Other specified abnormal findings of blood chemistry: Secondary | ICD-10-CM | POA: Diagnosis not present

## 2022-06-16 MED ORDER — DENOSUMAB 60 MG/ML ~~LOC~~ SOSY
PREFILLED_SYRINGE | SUBCUTANEOUS | Status: AC
  Administered 2022-06-16: 60 mg via SUBCUTANEOUS
  Filled 2022-06-16: qty 1

## 2022-06-16 MED ORDER — DENOSUMAB 60 MG/ML ~~LOC~~ SOSY: 60.0000 mg | PREFILLED_SYRINGE | Freq: Once | SUBCUTANEOUS | Status: AC

## 2022-06-27 NOTE — Telephone Encounter (Signed)
Patient called and stated that she does not want to do the Split at this time.  HST- scheduled for 06/28/22 at 10 AM.

## 2022-06-28 ENCOUNTER — Ambulatory Visit (INDEPENDENT_AMBULATORY_CARE_PROVIDER_SITE_OTHER): Payer: Medicare PPO | Admitting: Neurology

## 2022-06-28 DIAGNOSIS — I499 Cardiac arrhythmia, unspecified: Secondary | ICD-10-CM

## 2022-06-28 DIAGNOSIS — R0681 Apnea, not elsewhere classified: Secondary | ICD-10-CM

## 2022-06-28 DIAGNOSIS — I272 Pulmonary hypertension, unspecified: Secondary | ICD-10-CM

## 2022-06-28 DIAGNOSIS — K219 Gastro-esophageal reflux disease without esophagitis: Secondary | ICD-10-CM

## 2022-06-28 DIAGNOSIS — G4731 Primary central sleep apnea: Secondary | ICD-10-CM

## 2022-06-28 DIAGNOSIS — G4733 Obstructive sleep apnea (adult) (pediatric): Secondary | ICD-10-CM

## 2022-06-29 NOTE — Progress Notes (Signed)
[facility] TEST REPORT ( by Watch PAT)   STUDY DATA:  06-29-2022 DOB:  31-Jan-1941 MRN: [id number]   ORDERING CLINICIAN: Larey Seat, MD  REFERRING CLINICIAN: Dr Wallace Going   CLINICAL INFORMATION/HISTORY: 05-04-2022; pulmonary hypertension, snoring, sleeping good with 8 hours of sleep average at night, she attributes more energy recently to starting taking magnesium supplement.    Epworth sleepiness score: 5 /24. FSS at 26/ 63 points.    BMI:  27.5kg/m   Neck Circumference: 13.5 "   FINDINGS:   Sleep Summary:   Total Recording Time (hours, min):  9 h and 16 m.     Total Sleep Time (hours, min):    8 h and 35 m.             Percent REM (%):   27%                                     Respiratory Indices:   Calculated pAHI (per hour):      12.8/h                        REM pAHI:     15.5/h                                            NREM pAHI:   11.8/h                           Positional AHI:  Supine sleep was associated with an AHI of 15.1/h, and left lateral sleep with an AHI of 9.4/h.                                               Snoring: Mean Volume at 42 dB, 32 % of sleep time.   Oxygen Saturation Statistics:    O2 Saturation Range (%):  between 85% and 98% saturation, mean sat of 92%                                      O2 Saturation (minutes) <89%: 5.6 minutes          Pulse Rate Statistics:   Pulse Mean (bpm):    60 bpm             Pulse Range:  49 through 99 bpm                IMPRESSION:  This HST confirms the presence of very mild sleep apnea with slight exacerbation in REM sleep, and supine sleep. There is no longer significant hypoxemia found and overall OSA was in mild range, much less REM sleep dependent than in the previous sleep study.  Overall favorable outcome with no central apneas being recorded.    RECOMMENDATION: Avoiding supine sleep alone ( the sleep position on her back) may reduce the AHI to below 10/h. I  consider CPAP a viable option for treatment, but the patient could decide to forgo all PAP therapy, and  just choose behavior changes- like changing to the lateral sleep position.  She could also opt for a dental device now that hypoxia is not a problem.       INTERPRETING PHYSICIAN:   Larey Seat, MD   Medical Director of [facility].

## 2022-07-05 ENCOUNTER — Telehealth: Payer: Self-pay

## 2022-07-05 DIAGNOSIS — I272 Pulmonary hypertension, unspecified: Secondary | ICD-10-CM | POA: Insufficient documentation

## 2022-07-05 DIAGNOSIS — I499 Cardiac arrhythmia, unspecified: Secondary | ICD-10-CM | POA: Insufficient documentation

## 2022-07-05 DIAGNOSIS — G4733 Obstructive sleep apnea (adult) (pediatric): Secondary | ICD-10-CM | POA: Insufficient documentation

## 2022-07-05 DIAGNOSIS — R0681 Apnea, not elsewhere classified: Secondary | ICD-10-CM | POA: Insufficient documentation

## 2022-07-05 NOTE — Telephone Encounter (Signed)
I called patient to discuss. No answer, left a message asking her to call us back. If patient calls back another day please route to POD 1. 

## 2022-07-05 NOTE — Procedures (Signed)
    [facility]   HOME SLEEP TEST REPORT ( by Watch PAT)   STUDY DATA:  06-29-2022 DOB:  12/17/1940 MRN: [id number]   ORDERING CLINICIAN: Krithika Tome, MD  REFERRING CLINICIAN: Dr Ganji,MD   CLINICAL INFORMATION/HISTORY: 05-04-2022; pulmonary hypertension, snoring, sleeping good with 8 hours of sleep average at night, she attributes more energy recently to starting taking magnesium supplement.    Epworth sleepiness score: 5 /24. FSS at 26/ 63 points.    BMI:  27.5kg/m   Neck Circumference: 13.5 "   FINDINGS:   Sleep Summary:   Total Recording Time (hours, min):  9 h and 16 m.     Total Sleep Time (hours, min):    8 h and 35 m.             Percent REM (%):   27%                                     Respiratory Indices:   Calculated pAHI (per hour):      12.8/h                        REM pAHI:     15.5/h                                            NREM pAHI:   11.8/h                           Positional AHI:  Supine sleep was associated with an AHI of 15.1/h, and left lateral sleep with an AHI of 9.4/h.                                               Snoring: Mean Volume at 42 dB, 32 % of sleep time.   Oxygen Saturation Statistics:    O2 Saturation Range (%):  between 85% and 98% saturation, mean sat of 92%                                      O2 Saturation (minutes) <89%: 5.6 minutes          Pulse Rate Statistics:   Pulse Mean (bpm):    60 bpm             Pulse Range:  49 through 99 bpm                IMPRESSION:  This HST confirms the presence of very mild sleep apnea with slight exacerbation in REM sleep, and supine sleep. There is no longer significant hypoxemia found and overall OSA was in mild range, much less REM sleep dependent than in the previous sleep study.  Overall favorable outcome with no central apneas being recorded.    RECOMMENDATION: Avoiding supine sleep alone ( the sleep position on her back) may reduce the AHI to below 10/h. I  consider CPAP a viable option for treatment, but the patient could decide to forgo all PAP therapy, and   just choose behavior changes- like changing to the lateral sleep position.  She could also opt for a dental device now that hypoxia is not a problem.       INTERPRETING PHYSICIAN:   Ritchard Paragas, MD   Medical Director of [facility].                        

## 2022-07-05 NOTE — Telephone Encounter (Signed)
-----   Message from Larey Seat, MD sent at 07/05/2022  1:17 PM EDT ----- IMPRESSION:  This HST confirms the presence of very mild sleep apnea with slight exacerbation in REM sleep, and supine sleep. There is no longer significant hypoxemia found and overall OSA was in mild range, much less REM sleep dependent than in the previous sleep study.  Overall favorable outcome with no central apneas being recorded.   RECOMMENDATION: Avoiding supine sleep alone ( the sleep position on her back) may reduce the AHI to below 10/h. I consider CPAP a viable option for treatment, but the patient could decide to forgo all PAP therapy, and just choose behavior changes- like changing to the lateral sleep position.  She could also opt for a dental device now that hypoxia is not a problem.

## 2022-07-12 NOTE — Telephone Encounter (Signed)
Called and spoke with pt about sleep study results. Pt opted to try and avoid sleeping on her back for now and just on her side. If she is unable to do this, she will call back to discuss other options.

## 2022-08-08 DIAGNOSIS — I10 Essential (primary) hypertension: Secondary | ICD-10-CM | POA: Diagnosis not present

## 2022-08-08 DIAGNOSIS — R5383 Other fatigue: Secondary | ICD-10-CM | POA: Diagnosis not present

## 2022-08-08 DIAGNOSIS — J029 Acute pharyngitis, unspecified: Secondary | ICD-10-CM | POA: Diagnosis not present

## 2022-08-08 DIAGNOSIS — J069 Acute upper respiratory infection, unspecified: Secondary | ICD-10-CM | POA: Diagnosis not present

## 2022-08-08 DIAGNOSIS — R0981 Nasal congestion: Secondary | ICD-10-CM | POA: Diagnosis not present

## 2022-08-08 DIAGNOSIS — J302 Other seasonal allergic rhinitis: Secondary | ICD-10-CM | POA: Diagnosis not present

## 2022-08-08 DIAGNOSIS — R051 Acute cough: Secondary | ICD-10-CM | POA: Diagnosis not present

## 2022-08-08 DIAGNOSIS — Z1152 Encounter for screening for COVID-19: Secondary | ICD-10-CM | POA: Diagnosis not present

## 2022-09-19 DIAGNOSIS — M545 Low back pain, unspecified: Secondary | ICD-10-CM | POA: Diagnosis not present

## 2022-10-05 DIAGNOSIS — M5451 Vertebrogenic low back pain: Secondary | ICD-10-CM | POA: Diagnosis not present

## 2022-10-12 DIAGNOSIS — M5451 Vertebrogenic low back pain: Secondary | ICD-10-CM | POA: Diagnosis not present

## 2022-10-28 DIAGNOSIS — M5451 Vertebrogenic low back pain: Secondary | ICD-10-CM | POA: Diagnosis not present

## 2022-11-02 DIAGNOSIS — M5451 Vertebrogenic low back pain: Secondary | ICD-10-CM | POA: Diagnosis not present

## 2022-11-07 DIAGNOSIS — H40013 Open angle with borderline findings, low risk, bilateral: Secondary | ICD-10-CM | POA: Diagnosis not present

## 2022-11-07 DIAGNOSIS — H04123 Dry eye syndrome of bilateral lacrimal glands: Secondary | ICD-10-CM | POA: Diagnosis not present

## 2022-11-07 DIAGNOSIS — H26493 Other secondary cataract, bilateral: Secondary | ICD-10-CM | POA: Diagnosis not present

## 2022-11-07 DIAGNOSIS — H02831 Dermatochalasis of right upper eyelid: Secondary | ICD-10-CM | POA: Diagnosis not present

## 2022-12-30 ENCOUNTER — Other Ambulatory Visit (HOSPITAL_COMMUNITY): Payer: Self-pay | Admitting: *Deleted

## 2023-01-02 ENCOUNTER — Ambulatory Visit (HOSPITAL_COMMUNITY)
Admission: RE | Admit: 2023-01-02 | Discharge: 2023-01-02 | Disposition: A | Discharge status: Home / Self Care | Payer: Medicare PPO | Source: Ambulatory Visit | Attending: Internal Medicine | Admitting: Internal Medicine

## 2023-01-02 DIAGNOSIS — M810 Age-related osteoporosis without current pathological fracture: Secondary | ICD-10-CM | POA: Insufficient documentation

## 2023-01-02 MED ORDER — DENOSUMAB 60 MG/ML ~~LOC~~ SOSY
PREFILLED_SYRINGE | SUBCUTANEOUS | Status: AC
  Filled 2023-01-02: qty 1

## 2023-01-02 MED ORDER — DENOSUMAB 60 MG/ML ~~LOC~~ SOSY
60.0000 mg | PREFILLED_SYRINGE | Freq: Once | SUBCUTANEOUS | Status: AC
  Administered 2023-01-02: 60 mg via SUBCUTANEOUS

## 2023-01-17 DIAGNOSIS — Z1211 Encounter for screening for malignant neoplasm of colon: Secondary | ICD-10-CM | POA: Diagnosis not present

## 2023-01-17 DIAGNOSIS — E039 Hypothyroidism, unspecified: Secondary | ICD-10-CM | POA: Diagnosis not present

## 2023-01-17 DIAGNOSIS — F32A Depression, unspecified: Secondary | ICD-10-CM | POA: Diagnosis not present

## 2023-01-17 DIAGNOSIS — K219 Gastro-esophageal reflux disease without esophagitis: Secondary | ICD-10-CM | POA: Diagnosis not present

## 2023-02-14 DIAGNOSIS — Z1212 Encounter for screening for malignant neoplasm of rectum: Secondary | ICD-10-CM | POA: Diagnosis not present

## 2023-02-14 DIAGNOSIS — Z1211 Encounter for screening for malignant neoplasm of colon: Secondary | ICD-10-CM | POA: Diagnosis not present

## 2023-02-20 LAB — COLOGUARD: COLOGUARD: NEGATIVE

## 2023-03-29 DIAGNOSIS — Z7952 Long term (current) use of systemic steroids: Secondary | ICD-10-CM | POA: Diagnosis not present

## 2023-03-29 DIAGNOSIS — Z1231 Encounter for screening mammogram for malignant neoplasm of breast: Secondary | ICD-10-CM | POA: Diagnosis not present

## 2023-03-29 DIAGNOSIS — N958 Other specified menopausal and perimenopausal disorders: Secondary | ICD-10-CM | POA: Diagnosis not present

## 2023-03-29 DIAGNOSIS — M8588 Other specified disorders of bone density and structure, other site: Secondary | ICD-10-CM | POA: Diagnosis not present

## 2023-05-16 DIAGNOSIS — I10 Essential (primary) hypertension: Secondary | ICD-10-CM | POA: Diagnosis not present

## 2023-05-16 DIAGNOSIS — E039 Hypothyroidism, unspecified: Secondary | ICD-10-CM | POA: Diagnosis not present

## 2023-05-16 DIAGNOSIS — E559 Vitamin D deficiency, unspecified: Secondary | ICD-10-CM | POA: Diagnosis not present

## 2023-05-16 DIAGNOSIS — E785 Hyperlipidemia, unspecified: Secondary | ICD-10-CM | POA: Diagnosis not present

## 2023-05-16 DIAGNOSIS — Z23 Encounter for immunization: Secondary | ICD-10-CM | POA: Diagnosis not present

## 2023-05-16 DIAGNOSIS — Z1212 Encounter for screening for malignant neoplasm of rectum: Secondary | ICD-10-CM | POA: Diagnosis not present

## 2023-05-16 DIAGNOSIS — R739 Hyperglycemia, unspecified: Secondary | ICD-10-CM | POA: Diagnosis not present

## 2023-05-22 DIAGNOSIS — M810 Age-related osteoporosis without current pathological fracture: Secondary | ICD-10-CM | POA: Diagnosis not present

## 2023-05-22 DIAGNOSIS — Z0000 Encounter for general adult medical examination without abnormal findings: Secondary | ICD-10-CM | POA: Diagnosis not present

## 2023-05-22 DIAGNOSIS — I10 Essential (primary) hypertension: Secondary | ICD-10-CM | POA: Diagnosis not present

## 2023-05-22 DIAGNOSIS — E039 Hypothyroidism, unspecified: Secondary | ICD-10-CM | POA: Diagnosis not present

## 2023-05-22 DIAGNOSIS — I272 Pulmonary hypertension, unspecified: Secondary | ICD-10-CM | POA: Diagnosis not present

## 2023-05-22 DIAGNOSIS — G4731 Primary central sleep apnea: Secondary | ICD-10-CM | POA: Diagnosis not present

## 2023-05-22 DIAGNOSIS — F419 Anxiety disorder, unspecified: Secondary | ICD-10-CM | POA: Diagnosis not present

## 2023-05-22 DIAGNOSIS — M25562 Pain in left knee: Secondary | ICD-10-CM | POA: Diagnosis not present

## 2023-07-11 ENCOUNTER — Other Ambulatory Visit (HOSPITAL_COMMUNITY): Payer: Self-pay | Admitting: *Deleted

## 2023-07-12 ENCOUNTER — Ambulatory Visit (HOSPITAL_COMMUNITY)
Admission: RE | Admit: 2023-07-12 | Discharge: 2023-07-12 | Disposition: A | Discharge status: Home / Self Care | Payer: Medicare PPO | Source: Ambulatory Visit | Attending: Internal Medicine | Admitting: Internal Medicine

## 2023-07-12 DIAGNOSIS — M810 Age-related osteoporosis without current pathological fracture: Secondary | ICD-10-CM | POA: Insufficient documentation

## 2023-07-12 MED ORDER — DENOSUMAB 60 MG/ML ~~LOC~~ SOSY
PREFILLED_SYRINGE | SUBCUTANEOUS | Status: AC
  Filled 2023-07-12: qty 1

## 2023-07-12 MED ORDER — DENOSUMAB 60 MG/ML ~~LOC~~ SOSY
60.0000 mg | PREFILLED_SYRINGE | Freq: Once | SUBCUTANEOUS | Status: AC
  Administered 2023-07-12: 60 mg via SUBCUTANEOUS

## 2023-07-20 NOTE — Progress Notes (Deleted)
GYNECOLOGY  VISIT   HPI: 82 y.o.   Married  Caucasian female   G2P2002 with Patient's last menstrual period was 09/12/2000.   here for: discuss possible HRT     GYNECOLOGIC HISTORY: Patient's last menstrual period was 09/12/2000. Contraception:  PMP Menopausal hormone therapy:  n/a Pap:      Component Value Date/Time   DIAGPAP  05/28/2020 1542    - Negative for intraepithelial lesion or malignancy (NILM)   DIAGPAP  05/04/2018 0000    NEGATIVE FOR INTRAEPITHELIAL LESIONS OR MALIGNANCY.   ADEQPAP  05/28/2020 1542    Satisfactory for evaluation; transformation zone component PRESENT.   ADEQPAP  05/04/2018 0000    Satisfactory for evaluation. The presence or absence of an endocervical / transformation zone component cannot be determined because of atrophy.   History of abnormal Pap or positive HPV:  no Mammogram:  03/18/22 Breast Density Cat B, BI-RADS CAT 1 neg        OB History     Gravida  2   Para  2   Term  2   Preterm  0   AB  0   Living  2      SAB  0   IAB  0   Ectopic  0   Multiple  0   Live Births  2              Patient Active Problem List   Diagnosis Date Noted   Chronic cardiac arrhythmia 07/05/2022   Pulmonary hypertension (HCC) 07/05/2022   Chronic intermittent hypoxia with obstructive sleep apnea 07/05/2022   GERD with apnea 07/05/2022   Mitral valve prolapse 09/28/2018   Snoring 09/28/2018   PVC (premature ventricular contraction) 09/28/2018   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     CALCIUM PO Take by mouth.     denosumab (PROLIA) 60 MG/ML SOSY injection Inject 60 mg into the skin every 6 (six) months.     fish oil-omega-3 fatty acids 1000 MG capsule Take 1 g by mouth daily.      fluticasone (FLONASE) 50 MCG/ACT nasal spray Place 2 sprays into both nostrils 2 (two) times daily.      Ginger, Zingiber officinalis, (GINGER ROOT PO) Take 1 capsule by mouth daily in the afternoon.     glucosamine-chondroitin 500-400 MG tablet Take 1 tablet by mouth 3 (three) times daily.     indapamide (LOZOL) 1.25 MG tablet Take 1.25 mg by mouth 2 (two) times a week. On Saturday and Wednesday     levothyroxine (SYNTHROID, LEVOTHROID) 88 MCG tablet Take 88 mcg by mouth daily.       loratadine (CLARITIN) 10 MG tablet Take 10 mg by mouth daily.     Magnesium 400 MG TABS Take 1 tablet by mouth daily.     Multiple Vitamins-Minerals (MULTIVITAMIN  PO) Take by mouth.     Multiple Vitamins-Minerals (ONE-A-DAY VITACRAVES IMMUNITY PO) Take by mouth.     pantoprazole (PROTONIX) 40 MG tablet Take 40 mg by mouth daily.     sertraline (ZOLOFT) 50 MG tablet Take 50 mg by mouth daily.     TURMERIC PO Take 1 capsule by mouth daily.     Vitamin D, Ergocalciferol, (DRISDOL) 50000 UNITS CAPS Take 50,000 Units by mouth every 14 (fourteen) days.      No current facility-administered medications for this visit.     ALLERGIES: Codeine and Sulfa antibiotics  Family History  Problem Relation Age of Onset   Heart disease Mother    Stroke Mother        CVA   Mitral valve prolapse Mother    AAA (abdominal aortic aneurysm) Father    Heart disease Sister    Mitral valve prolapse Sister    Atrial fibrillation Sister    Non-Hodgkin's lymphoma Sister    Breast cancer Other 38       neice    Social History   Socioeconomic History   Marital status: Married    Spouse name: Molly Maduro   Number of children: 2    Years of education: Not on file   Highest education level: Master's degree (e.g., MA, MS, MEng, MEd, MSW, MBA)  Occupational History   Not on file  Tobacco Use   Smoking status: Never   Smokeless tobacco: Never  Vaping Use   Vaping status: Never Used  Substance and Sexual Activity   Alcohol use: Yes    Comment: occ beer   Drug use: No   Sexual activity: Not Currently    Partners: Male    Birth control/protection: Post-menopausal  Other Topics Concern   Not on file  Social History Narrative   Lives at home with husband and grandson   R handed   Caffeine: 5 C of coffee a day and occ diet coke   Social Determinants of Health   Financial Resource Strain: Not on file  Food Insecurity: Not on file  Transportation Needs: Not on file  Physical Activity: Not on file  Stress: Not on file  Social Connections: Not on file  Intimate Partner Violence: Not on file    Review of Systems  PHYSICAL EXAMINATION:   LMP 09/12/2000     General appearance: alert, cooperative and appears stated age Head: Normocephalic, without obvious abnormality, atraumatic Neck: no adenopathy, supple, symmetrical, trachea midline and thyroid normal to inspection and palpation Lungs: clear to auscultation bilaterally Breasts: normal appearance, no masses or tenderness, No nipple retraction or dimpling, No nipple discharge or bleeding, No axillary or supraclavicular adenopathy Heart: regular rate and rhythm Abdomen: soft, non-tender, no masses,  no organomegaly Extremities: extremities normal, atraumatic, no cyanosis or edema Skin: Skin color, texture, turgor normal. No rashes or lesions Lymph nodes: Cervical, supraclavicular, and axillary nodes normal. No abnormal inguinal nodes palpated Neurologic: Grossly normal  Pelvic: External genitalia:  no lesions              Urethra:  normal appearing urethra with no masses, tenderness or lesions              Bartholins and Skenes: normal                  Vagina: normal appearing vagina with normal color and discharge, no lesions              Cervix: no lesions  Bimanual Exam:  Uterus:  normal size, contour, position, consistency, mobility, non-tender              Adnexa: no mass, fullness, tenderness              Rectal exam: {yes no:314532}.  Confirms.              Anus:  normal sphincter tone, no lesions  Chaperone was present for exam:  {BSCHAPERONE:31226::"Lelon Ikard F, CMA"}  {LABS (Optional):23779}  ASSESSMENT:    PLAN:      ***  total time was spent for this patient encounter, including preparation, face-to-face counseling with the patient, coordination of care, and documentation of the encounter.  An After Visit Summary was provided to the patient.

## 2023-08-03 ENCOUNTER — Ambulatory Visit: Payer: Medicare PPO | Admitting: Obstetrics and Gynecology

## 2023-08-14 DIAGNOSIS — D692 Other nonthrombocytopenic purpura: Secondary | ICD-10-CM | POA: Diagnosis not present

## 2023-08-14 DIAGNOSIS — L821 Other seborrheic keratosis: Secondary | ICD-10-CM | POA: Diagnosis not present

## 2023-08-14 DIAGNOSIS — L814 Other melanin hyperpigmentation: Secondary | ICD-10-CM | POA: Diagnosis not present

## 2023-11-08 DIAGNOSIS — H40013 Open angle with borderline findings, low risk, bilateral: Secondary | ICD-10-CM | POA: Diagnosis not present

## 2023-11-08 DIAGNOSIS — H26493 Other secondary cataract, bilateral: Secondary | ICD-10-CM | POA: Diagnosis not present

## 2023-11-08 DIAGNOSIS — H04123 Dry eye syndrome of bilateral lacrimal glands: Secondary | ICD-10-CM | POA: Diagnosis not present

## 2023-11-08 DIAGNOSIS — H02831 Dermatochalasis of right upper eyelid: Secondary | ICD-10-CM | POA: Diagnosis not present

## 2024-02-26 ENCOUNTER — Other Ambulatory Visit (HOSPITAL_COMMUNITY): Payer: Self-pay | Admitting: *Deleted

## 2024-02-27 ENCOUNTER — Encounter (HOSPITAL_COMMUNITY)
Admission: RE | Admit: 2024-02-27 | Discharge: 2024-02-27 | Disposition: A | Discharge status: Home / Self Care | Source: Ambulatory Visit | Attending: Internal Medicine | Admitting: Internal Medicine

## 2024-02-27 DIAGNOSIS — M810 Age-related osteoporosis without current pathological fracture: Secondary | ICD-10-CM | POA: Insufficient documentation

## 2024-02-27 MED ORDER — DENOSUMAB 60 MG/ML ~~LOC~~ SOSY
PREFILLED_SYRINGE | SUBCUTANEOUS | Status: AC
Start: 2024-02-27 — End: 2024-02-27
  Filled 2024-02-27: qty 1

## 2024-02-27 MED ORDER — DENOSUMAB 60 MG/ML ~~LOC~~ SOSY
60.0000 mg | PREFILLED_SYRINGE | Freq: Once | SUBCUTANEOUS | Status: AC
  Administered 2024-02-27: 60 mg via SUBCUTANEOUS

## 2024-04-03 DIAGNOSIS — Z1231 Encounter for screening mammogram for malignant neoplasm of breast: Secondary | ICD-10-CM | POA: Diagnosis not present

## 2024-04-03 LAB — HM MAMMOGRAPHY

## 2024-04-05 ENCOUNTER — Encounter: Payer: Self-pay | Admitting: Obstetrics and Gynecology

## 2024-04-09 ENCOUNTER — Ambulatory Visit: Payer: Self-pay | Admitting: Obstetrics and Gynecology

## 2024-04-17 DIAGNOSIS — R928 Other abnormal and inconclusive findings on diagnostic imaging of breast: Secondary | ICD-10-CM | POA: Diagnosis not present

## 2024-04-17 DIAGNOSIS — R921 Mammographic calcification found on diagnostic imaging of breast: Secondary | ICD-10-CM | POA: Diagnosis not present

## 2024-05-02 DIAGNOSIS — N6091 Unspecified benign mammary dysplasia of right breast: Secondary | ICD-10-CM | POA: Diagnosis not present

## 2024-05-02 DIAGNOSIS — R921 Mammographic calcification found on diagnostic imaging of breast: Secondary | ICD-10-CM | POA: Diagnosis not present

## 2024-05-10 ENCOUNTER — Ambulatory Visit: Payer: Self-pay | Admitting: Surgery

## 2024-05-10 DIAGNOSIS — N6091 Unspecified benign mammary dysplasia of right breast: Secondary | ICD-10-CM

## 2024-05-10 DIAGNOSIS — N6081 Other benign mammary dysplasias of right breast: Secondary | ICD-10-CM | POA: Diagnosis not present

## 2024-05-16 DIAGNOSIS — E559 Vitamin D deficiency, unspecified: Secondary | ICD-10-CM | POA: Diagnosis not present

## 2024-05-16 DIAGNOSIS — E039 Hypothyroidism, unspecified: Secondary | ICD-10-CM | POA: Diagnosis not present

## 2024-05-16 DIAGNOSIS — I10 Essential (primary) hypertension: Secondary | ICD-10-CM | POA: Diagnosis not present

## 2024-05-16 DIAGNOSIS — R739 Hyperglycemia, unspecified: Secondary | ICD-10-CM | POA: Diagnosis not present

## 2024-05-23 DIAGNOSIS — Z23 Encounter for immunization: Secondary | ICD-10-CM | POA: Diagnosis not present

## 2024-05-23 DIAGNOSIS — I10 Essential (primary) hypertension: Secondary | ICD-10-CM | POA: Diagnosis not present

## 2024-05-23 DIAGNOSIS — M810 Age-related osteoporosis without current pathological fracture: Secondary | ICD-10-CM | POA: Diagnosis not present

## 2024-05-23 DIAGNOSIS — K219 Gastro-esophageal reflux disease without esophagitis: Secondary | ICD-10-CM | POA: Diagnosis not present

## 2024-05-23 DIAGNOSIS — Z0000 Encounter for general adult medical examination without abnormal findings: Secondary | ICD-10-CM | POA: Diagnosis not present

## 2024-05-23 DIAGNOSIS — R82998 Other abnormal findings in urine: Secondary | ICD-10-CM | POA: Diagnosis not present

## 2024-05-23 DIAGNOSIS — E039 Hypothyroidism, unspecified: Secondary | ICD-10-CM | POA: Diagnosis not present

## 2024-05-23 DIAGNOSIS — G4733 Obstructive sleep apnea (adult) (pediatric): Secondary | ICD-10-CM | POA: Diagnosis not present

## 2024-05-23 DIAGNOSIS — I272 Pulmonary hypertension, unspecified: Secondary | ICD-10-CM | POA: Diagnosis not present

## 2024-05-23 DIAGNOSIS — M170 Bilateral primary osteoarthritis of knee: Secondary | ICD-10-CM | POA: Diagnosis not present

## 2024-06-03 ENCOUNTER — Encounter (HOSPITAL_BASED_OUTPATIENT_CLINIC_OR_DEPARTMENT_OTHER): Payer: Self-pay | Admitting: Surgery

## 2024-06-06 ENCOUNTER — Encounter (HOSPITAL_BASED_OUTPATIENT_CLINIC_OR_DEPARTMENT_OTHER)
Admission: RE | Admit: 2024-06-06 | Discharge: 2024-06-06 | Disposition: A | Discharge status: Home / Self Care | Source: Ambulatory Visit | Attending: Surgery | Admitting: Surgery

## 2024-06-06 DIAGNOSIS — Z01818 Encounter for other preprocedural examination: Secondary | ICD-10-CM | POA: Diagnosis not present

## 2024-06-06 DIAGNOSIS — N6081 Other benign mammary dysplasias of right breast: Secondary | ICD-10-CM | POA: Diagnosis not present

## 2024-06-06 LAB — BASIC METABOLIC PANEL WITH GFR
Anion gap: 13 (ref 5–15)
BUN: 9 mg/dL (ref 8–23)
CO2: 23 mmol/L (ref 22–32)
Calcium: 9.2 mg/dL (ref 8.9–10.3)
Chloride: 100 mmol/L (ref 98–111)
Creatinine, Ser: 0.81 mg/dL (ref 0.44–1.00)
GFR, Estimated: >60 mL/min (ref >60)
Glucose, Bld: 103 mg/dL — ABNORMAL HIGH (ref 70–99)
Potassium: 3.9 mmol/L (ref 3.5–5.1)
Sodium: 136 mmol/L (ref 135–145)

## 2024-06-06 MED ORDER — CHLORHEXIDINE GLUCONATE CLOTH 2 % EX PADS: 6.0000 | MEDICATED_PAD | Freq: Once | CUTANEOUS | Status: DC

## 2024-06-06 NOTE — Progress Notes (Signed)

## 2024-06-10 ENCOUNTER — Encounter (HOSPITAL_BASED_OUTPATIENT_CLINIC_OR_DEPARTMENT_OTHER): Payer: Self-pay | Admitting: Surgery

## 2024-06-10 NOTE — Anesthesia Preprocedure Evaluation (Addendum)
 Anesthesia Evaluation  Patient identified by MRN, date of birth, ID band Patient awake    Reviewed: Allergy & Precautions, NPO status , Patient's Chart, lab work & pertinent test results  Airway Mallampati: II       Dental no notable dental hx. (+) Teeth Intact, Dental Advisory Given, Caps   Pulmonary sleep apnea and Continuous Positive Airway Pressure Ventilation    Pulmonary exam normal breath sounds clear to auscultation       Cardiovascular hypertension, Pt. on medications Normal cardiovascular exam+ Valvular Problems/Murmurs MVP  Rhythm:Regular Rate:Normal  EKG 06/06/24 NSR, borderline low voltage   Neuro/Psych  PSYCHIATRIC DISORDERS  Depression    negative neurological ROS     GI/Hepatic Neg liver ROS,GERD  Medicated,,  Endo/Other  Hypothyroidism  Atypical ductal hyperplasia right breast  Renal/GU negative Renal ROS  negative genitourinary   Musculoskeletal negative musculoskeletal ROS (+)    Abdominal   Peds  Hematology negative hematology ROS (+)   Anesthesia Other Findings   Reproductive/Obstetrics                              Anesthesia Physical Anesthesia Plan  ASA: 2  Anesthesia Plan: General   Post-op Pain Management: Minimal or no pain anticipated and Dilaudid IV   Induction: Intravenous  PONV Risk Score and Plan: 4 or greater and Treatment may vary due to age or medical condition, Ondansetron, Propofol infusion, TIVA and Dexamethasone  Airway Management Planned: LMA  Additional Equipment: None  Intra-op Plan:   Post-operative Plan: Extubation in OR  Informed Consent: I have reviewed the patients History and Physical, chart, labs and discussed the procedure including the risks, benefits and alternatives for the proposed anesthesia with the patient or authorized representative who has indicated his/her understanding and acceptance.     Dental advisory  given  Plan Discussed with: CRNA and Anesthesiologist  Anesthesia Plan Comments:          Anesthesia Quick Evaluation

## 2024-06-11 ENCOUNTER — Encounter (HOSPITAL_BASED_OUTPATIENT_CLINIC_OR_DEPARTMENT_OTHER): Payer: Self-pay | Admitting: Surgery

## 2024-06-11 ENCOUNTER — Ambulatory Visit (HOSPITAL_BASED_OUTPATIENT_CLINIC_OR_DEPARTMENT_OTHER): Payer: Self-pay | Admitting: Anesthesiology

## 2024-06-11 ENCOUNTER — Other Ambulatory Visit: Payer: Self-pay

## 2024-06-11 ENCOUNTER — Encounter (HOSPITAL_BASED_OUTPATIENT_CLINIC_OR_DEPARTMENT_OTHER)
Admission: RE | Disposition: A | Discharge status: Home / Self Care | Payer: Self-pay | Source: Home / Self Care | Attending: Surgery

## 2024-06-11 ENCOUNTER — Ambulatory Visit (HOSPITAL_BASED_OUTPATIENT_CLINIC_OR_DEPARTMENT_OTHER)
Admission: RE | Admit: 2024-06-11 | Discharge: 2024-06-11 | Disposition: A | Discharge status: Home / Self Care | Attending: Surgery | Admitting: Surgery

## 2024-06-11 DIAGNOSIS — F32A Depression, unspecified: Secondary | ICD-10-CM | POA: Diagnosis not present

## 2024-06-11 DIAGNOSIS — I341 Nonrheumatic mitral (valve) prolapse: Secondary | ICD-10-CM | POA: Diagnosis not present

## 2024-06-11 DIAGNOSIS — N6011 Diffuse cystic mastopathy of right breast: Secondary | ICD-10-CM | POA: Diagnosis not present

## 2024-06-11 DIAGNOSIS — Z01818 Encounter for other preprocedural examination: Secondary | ICD-10-CM

## 2024-06-11 DIAGNOSIS — K219 Gastro-esophageal reflux disease without esophagitis: Secondary | ICD-10-CM | POA: Insufficient documentation

## 2024-06-11 DIAGNOSIS — N6091 Unspecified benign mammary dysplasia of right breast: Secondary | ICD-10-CM | POA: Diagnosis not present

## 2024-06-11 DIAGNOSIS — N6001 Solitary cyst of right breast: Secondary | ICD-10-CM | POA: Insufficient documentation

## 2024-06-11 DIAGNOSIS — E039 Hypothyroidism, unspecified: Secondary | ICD-10-CM | POA: Diagnosis not present

## 2024-06-11 DIAGNOSIS — N6021 Fibroadenosis of right breast: Secondary | ICD-10-CM | POA: Diagnosis not present

## 2024-06-11 DIAGNOSIS — G4730 Sleep apnea, unspecified: Secondary | ICD-10-CM | POA: Insufficient documentation

## 2024-06-11 DIAGNOSIS — Z803 Family history of malignant neoplasm of breast: Secondary | ICD-10-CM | POA: Insufficient documentation

## 2024-06-11 DIAGNOSIS — N6081 Other benign mammary dysplasias of right breast: Secondary | ICD-10-CM | POA: Diagnosis not present

## 2024-06-11 DIAGNOSIS — I10 Essential (primary) hypertension: Secondary | ICD-10-CM | POA: Diagnosis not present

## 2024-06-11 SURGERY — BIOPSY, BREAST WITH MAGNETIC MARKER LOCALIZATION
Anesthesia: General | Laterality: Right

## 2024-06-11 MED ORDER — OXYCODONE HCL 5 MG/5ML PO SOLN: 5.0000 mg | Freq: Once | ORAL | Status: DC | PRN

## 2024-06-11 MED ORDER — ONDANSETRON HCL 4 MG/2ML IJ SOLN: 4.0000 mg | Freq: Once | INTRAMUSCULAR | Status: DC | PRN

## 2024-06-11 MED ORDER — FENTANYL CITRATE (PF) 100 MCG/2ML IJ SOLN
INTRAMUSCULAR | Status: DC | PRN
  Administered 2024-06-11: 50 ug via INTRAVENOUS

## 2024-06-11 MED ORDER — LIDOCAINE 2% (20 MG/ML) 5 ML SYRINGE
INTRAMUSCULAR | Status: DC | PRN
  Administered 2024-06-11: 80 mg via INTRAVENOUS

## 2024-06-11 MED ORDER — DEXMEDETOMIDINE HCL IN NACL 80 MCG/20ML IV SOLN
INTRAVENOUS | Status: DC | PRN
  Administered 2024-06-11: 4 ug via INTRAVENOUS

## 2024-06-11 MED ORDER — CEFAZOLIN SODIUM-DEXTROSE 2-3 GM-%(50ML) IV SOLR
INTRAVENOUS | Status: DC | PRN
  Administered 2024-06-11: 2 g via INTRAVENOUS

## 2024-06-11 MED ORDER — GLYCOPYRROLATE PF 0.2 MG/ML IJ SOSY
PREFILLED_SYRINGE | INTRAMUSCULAR | Status: AC
  Filled 2024-06-11: qty 1

## 2024-06-11 MED ORDER — 0.9 % SODIUM CHLORIDE (POUR BTL) OPTIME
TOPICAL | Status: DC | PRN
  Administered 2024-06-11: 1000 mL

## 2024-06-11 MED ORDER — ONDANSETRON HCL 4 MG/2ML IJ SOLN
INTRAMUSCULAR | Status: DC | PRN
  Administered 2024-06-11: 4 mg via INTRAVENOUS

## 2024-06-11 MED ORDER — DEXMEDETOMIDINE HCL IN NACL 80 MCG/20ML IV SOLN
INTRAVENOUS | Status: AC
  Filled 2024-06-11: qty 20

## 2024-06-11 MED ORDER — FENTANYL CITRATE (PF) 100 MCG/2ML IJ SOLN
INTRAMUSCULAR | Status: AC
  Filled 2024-06-11: qty 2

## 2024-06-11 MED ORDER — FENTANYL CITRATE (PF) 100 MCG/2ML IJ SOLN: 25.0000 ug | INTRAMUSCULAR | Status: DC | PRN

## 2024-06-11 MED ORDER — OXYCODONE HCL 5 MG PO TABS: 5.0000 mg | ORAL_TABLET | Freq: Four times a day (QID) | ORAL | 0 refills | Status: AC | PRN

## 2024-06-11 MED ORDER — BUPIVACAINE-EPINEPHRINE (PF) 0.25% -1:200000 IJ SOLN
INTRAMUSCULAR | Status: DC | PRN
  Administered 2024-06-11: 20 mL

## 2024-06-11 MED ORDER — PROPOFOL 10 MG/ML IV BOLUS
INTRAVENOUS | Status: DC | PRN
  Administered 2024-06-11: 160 mg via INTRAVENOUS

## 2024-06-11 MED ORDER — LACTATED RINGERS IV SOLN: INTRAVENOUS | Status: DC

## 2024-06-11 MED ORDER — CEFAZOLIN SODIUM-DEXTROSE 2-4 GM/100ML-% IV SOLN: 2.0000 g | INTRAVENOUS | Status: DC

## 2024-06-11 MED ORDER — DEXAMETHASONE SODIUM PHOSPHATE 10 MG/ML IJ SOLN
INTRAMUSCULAR | Status: DC | PRN
  Administered 2024-06-11: 4 mg via INTRAVENOUS

## 2024-06-11 MED ORDER — CEFAZOLIN SODIUM-DEXTROSE 2-4 GM/100ML-% IV SOLN
INTRAVENOUS | Status: AC
  Filled 2024-06-11: qty 100

## 2024-06-11 MED ORDER — AMISULPRIDE (ANTIEMETIC) 5 MG/2ML IV SOLN: 10.0000 mg | Freq: Once | INTRAVENOUS | Status: DC | PRN

## 2024-06-11 MED ORDER — PROPOFOL 500 MG/50ML IV EMUL
INTRAVENOUS | Status: DC | PRN
  Administered 2024-06-11: 75 ug/kg/min via INTRAVENOUS
  Administered 2024-06-11: 100 ug/kg/min via INTRAVENOUS

## 2024-06-11 MED ORDER — OXYCODONE HCL 5 MG PO TABS: 5.0000 mg | ORAL_TABLET | Freq: Once | ORAL | Status: DC | PRN

## 2024-06-11 SURGICAL SUPPLY — 41 items
BINDER BREAST LRG (GAUZE/BANDAGES/DRESSINGS) IMPLANT
BINDER BREAST MEDIUM (GAUZE/BANDAGES/DRESSINGS) IMPLANT
BINDER BREAST XLRG (GAUZE/BANDAGES/DRESSINGS) IMPLANT
BINDER BREAST XXLRG (GAUZE/BANDAGES/DRESSINGS) IMPLANT
BLADE SURG 15 STRL LF DISP TIS (BLADE) ×1 IMPLANT
CANISTER SUC SOCK COL 7IN (MISCELLANEOUS) IMPLANT
CANISTER SUCT 1200ML W/VALVE (MISCELLANEOUS) IMPLANT
CHLORAPREP W/TINT 26 (MISCELLANEOUS) ×1 IMPLANT
CLIP APPLIE 9.375 MED OPEN (MISCELLANEOUS) IMPLANT
COVER BACK TABLE 60X90IN (DRAPES) ×1 IMPLANT
COVER MAYO STAND STRL (DRAPES) ×1 IMPLANT
COVER PROBE CYLINDRICAL 5X96 (MISCELLANEOUS) ×1 IMPLANT
DERMABOND ADVANCED .7 DNX12 (GAUZE/BANDAGES/DRESSINGS) ×1 IMPLANT
DRAPE LAPAROSCOPIC ABDOMINAL (DRAPES) IMPLANT
DRAPE LAPAROTOMY 100X72 PEDS (DRAPES) ×1 IMPLANT
DRAPE UTILITY XL STRL (DRAPES) ×1 IMPLANT
ELECT COATED BLADE 2.86 ST (ELECTRODE) ×1 IMPLANT
ELECTRODE REM PT RTRN 9FT ADLT (ELECTROSURGICAL) ×1 IMPLANT
GLOVE BIOGEL PI IND STRL 8 (GLOVE) ×1 IMPLANT
GLOVE ECLIPSE 8.0 STRL XLNG CF (GLOVE) ×1 IMPLANT
GOWN STRL REUS W/ TWL LRG LVL3 (GOWN DISPOSABLE) ×2 IMPLANT
GOWN STRL REUS W/ TWL XL LVL3 (GOWN DISPOSABLE) ×1 IMPLANT
HEMOSTAT ARISTA ABSORB 3G PWDR (HEMOSTASIS) IMPLANT
HEMOSTAT SNOW SURGICEL 2X4 (HEMOSTASIS) IMPLANT
KIT MARKER MARGIN INK (KITS) ×1 IMPLANT
NDL HYPO 25X1 1.5 SAFETY (NEEDLE) ×1 IMPLANT
NEEDLE HYPO 25X1 1.5 SAFETY (NEEDLE) ×1 IMPLANT
NS IRRIG 1000ML POUR BTL (IV SOLUTION) ×1 IMPLANT
PACK BASIN DAY SURGERY FS (CUSTOM PROCEDURE TRAY) ×1 IMPLANT
PENCIL SMOKE EVACUATOR (MISCELLANEOUS) ×1 IMPLANT
SLEEVE SCD COMPRESS KNEE MED (STOCKING) ×1 IMPLANT
SPIKE FLUID TRANSFER (MISCELLANEOUS) IMPLANT
SPONGE T-LAP 4X18 ~~LOC~~+RFID (SPONGE) ×1 IMPLANT
SUT MNCRL AB 4-0 PS2 18 (SUTURE) ×1 IMPLANT
SUT SILK 2 0 SH (SUTURE) IMPLANT
SUT VICRYL 3-0 CR8 SH (SUTURE) ×1 IMPLANT
SYR CONTROL 10ML LL (SYRINGE) ×1 IMPLANT
TOWEL GREEN STERILE FF (TOWEL DISPOSABLE) ×1 IMPLANT
TRAY FAXITRON CT DISP (TRAY / TRAY PROCEDURE) ×1 IMPLANT
TUBE CONNECTING 20X1/4 (TUBING) IMPLANT
YANKAUER SUCT BULB TIP NO VENT (SUCTIONS) IMPLANT

## 2024-06-11 NOTE — Op Note (Signed)
 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  Margins are grossly normal  Indications for procedure: The patient is an 83 year old female who was found to have a right breast abnormality on mammogram.  Core biopsy found atypical ductal hyperplasia.  She was seen in consultation and we reviewed the pros and cons of lumpectomy given the fact that 1 in 5 patients with ADH on core biopsy will have a small early breast cancer.  She opted for lumpectomy after reviewing her options of both surgical and observation.The procedure has been discussed with the patient. Alternatives to surgery have been discussed with the patient.  Risks of surgery include bleeding,  Infection,  Seroma formation, death,  and the need for further surgery.   The patient understands and wishes to proceed.   Description of procedure: The patient was met in the holding area and questions were answered.  The right breast was marked as the correct site and she had seed placement done as an outpatient at [facility].  Images were available.  Right breast was marked as the correct site.  She was taken back to the operative room and placed supine upon the OR table.  After induction of general anesthesia, right breast was prepped and draped in a sterile fashion and timeout performed.  The mag trace probe was used to identify the seed location in the right breast in the upper outer quadrant.  This marked with a pen.  Curvilinear incision was made over the signal in all tissue around both seeds and the biopsy clip were excised with grossly negative margins.  Imaging revealed both localizing seeds and  clip to be in the specimen.  Margins were grossly normal.  Images were taken and placed into epic.  The cavity was irrigated made hemostatic.  Local anesthetic was infiltrated.  The deep tissue planes were approximated with 3-0 Vicryl.  4-0 Monocryl is used to close the skin in a subcuticular fashion.  Dermabond was applied.  All counts were found to be correct.  Breast binder placed.  The patient was then awoke extubated taken to recovery in satisfactory condition.

## 2024-06-11 NOTE — H&P (Signed)
 Chief Complaint: New Consultation  History of Present Illness: Andrea Henderson is a 83 y.o. female who is seen today as an office consultation for evaluation of New Consultation  Patient sent for evaluation of abnormal right mammogram. She had a 4 mm cluster right breast microcalcifications upper outer quadrant core biopsy proven to be atypical ductal hyperplasia. She has 1 first-degree relative with breast cancer. She denies any pain, mass or discharge.  Review of Systems: A complete review of systems was obtained from the patient. I have reviewed this information and discussed as appropriate with the patient. See HPI as well for other ROS.    Medical History: Past Medical History:  Diagnosis Date  Cataracts, both eyes  Depression  Glaucoma (increased eye pressure)  History of COVID-19  Hypertension  Hypothyroid  Vitamin D deficiency disease   There is no problem list on file for this patient.  Past Surgical History:  Procedure Laterality Date  Refractive surgery Bilateral 12/2006  CATARACT EXTRACTION 10/13/2020  thumb repair  GLAUCOMA EYE SURGERY    Allergies  Allergen Reactions  Codeine Unknown  Sulfa (Sulfonamide Antibiotics) Rash  Childhood reaction   Current Outpatient Medications on File Prior to Visit  Medication Sig Dispense Refill  BIOTIN ORAL Take 1 tablet by mouth once daily  denosumab  (PROLIA ) 60 mg/mL inj syringe Inject 60 mg subcutaneously every 6 (six) months  ergocalciferol, vitamin D2, 1,250 mcg (50,000 unit) capsule Take 50,000 Units by mouth every 14 (fourteen) days  fluticasone propionate (FLONASE) 50 mcg/actuation nasal spray Place into both nostrils once daily  glucosamine su 2KCl-chondroit 500-400 mg Tab Take 1 tablet by mouth 3 (three) times daily  indapamide (LOZOL) 1.25 MG tablet Take 1.25 mg by mouth  levothyroxine (SYNTHROID) 88 MCG tablet TAKE 1 TABLET BY MOUTH EVERY DAY, EXCEPT NONE ON SUNDAYS ORAL 90 DAYS; Duration: 90  loratadine (CLARITIN)  10 mg tablet Take 10 mg by mouth once daily  omega-3-dha-epa-fish oil 300-1,000 mg capsule Take 1 g by mouth once daily  pantoprazole (PROTONIX) 40 MG DR tablet Take 40 mg by mouth once daily  sertraline (ZOLOFT) 50 MG tablet Take 50 mg by mouth once daily   No current facility-administered medications on file prior to visit.   Family History  Problem Relation Age of Onset  Heart disease Mother  Mitral valve prolapse Mother  Stroke Mother  Other (Abdominal aortic aneurysm) Father  Heart disease Sister  Mitral valve prolapse Sister  Atrial fibrillation (Abnormal heart rhythm sometimes requiring treatment with blood thinners) Sister  Other (Non-Hodgkin's lymphoma) Sister    Social History   Tobacco Use  Smoking Status Never  Smokeless Tobacco Never    Social History   Socioeconomic History  Marital status: Unknown  Tobacco Use  Smoking status: Never  Smokeless tobacco: Never  Vaping Use  Vaping status: Never Used  Substance and Sexual Activity  Alcohol  use: Yes  Alcohol /week: 0.0 - 1.0 standard drinks of alcohol   Drug use: Never   Objective:   Vitals:  05/10/24 0935 05/10/24 0942  BP: 118/70  Temp: 36.7 C (98.1 F)  Weight: 64.4 kg (142 lb)  Height: 154.9 cm (5' 1)  PainSc: 0-No pain  PainLoc: Breast   Body mass index is 26.83 kg/m.  Physical Exam Exam conducted with a chaperone present.  Cardiovascular:  Rate and Rhythm: Normal rate.  Pulmonary:  Effort: Pulmonary effort is normal.  Chest:  Breasts: Right: Normal.  Left: Normal.   Comments: Bruising noted Lymphadenopathy:  Upper Body:  Right  upper body: No axillary adenopathy.  Left upper body: No axillary adenopathy.     Labs, Imaging and Diagnostic Testing: Mammogram right shows 4 mm cluster atypical calcifications right breast. Core biopsy proven to be atypical ductal hyperplasia.  Assessment and Plan:   Diagnoses and all orders for this visit:  Atypical ductal hyperplasia of right  breast   Discussed potential upgrade risk of over 20%. Discussed high risk lesion for breast cancer. The pros and cons of lumpectomy reviewed and recommend lumpectomy due to upgrade risk. She is undecided but is leaning toward having this done. We discussed nonoperative measures as well which include mammogram in 6 months.The procedure has been discussed with the patient. Alternatives to surgery have been discussed with the patient. Risks of surgery include bleeding, Infection, Seroma formation, death, and the need for further surgery. The patient understands and wishes to proceed.    DEBBY CURTISTINE SHIPPER, MD

## 2024-06-11 NOTE — Interval H&P Note (Signed)
 History and Physical Interval Note:  06/11/2024 8:40 AM  Andrea Henderson  has presented today for surgery, with the diagnosis of RIGHT ATYPICAL DUCTAL HYPERPLASIA.  The various methods of treatment have been discussed with the patient and family. After consideration of risks, benefits and other options for treatment, the patient has consented to  Procedure(s) with comments: BIOPSY, BREAST WITH MAGNETIC MARKER LOCALIZATION (Right) - RIGHT BREAST SEED LUMPECTOMY as a surgical intervention.  The patient's history has been reviewed, patient examined, no change in status, stable for surgery.  I have reviewed the patient's chart and labs.  Questions were answered to the patient's satisfaction.     Enes Wegener A Lesly Joslyn

## 2024-06-11 NOTE — Anesthesia Postprocedure Evaluation (Signed)
 Anesthesia Post Note  Patient: Andrea Henderson  Procedure(s) Performed: BIOPSY, BREAST WITH MAGNETIC MARKER LOCALIZATION (Right)     Patient location during evaluation: PACU Anesthesia Type: General Level of consciousness: awake and alert and oriented Pain management: pain level controlled Vital Signs Assessment: post-procedure vital signs reviewed and stable Respiratory status: spontaneous breathing, nonlabored ventilation and respiratory function stable Cardiovascular status: blood pressure returned to baseline and stable Postop Assessment: no apparent nausea or vomiting Anesthetic complications: no   No notable events documented.  Last Vitals:  Vitals:   06/11/24 1030 06/11/24 1041  BP: 124/70 102/64  Pulse: 63 66  Resp: 15 16  Temp:  (!) 36.2 C  SpO2: 97% 97%    Last Pain:  Vitals:   06/11/24 1041  TempSrc:   PainSc: 0-No pain                 Rhonda Linan A.

## 2024-06-11 NOTE — Discharge Instructions (Addendum)
[facility] Office Phone Number [phone]  BREAST BIOPSY/ PARTIAL MASTECTOMY: POST OP INSTRUCTIONS  Always review your discharge instruction sheet given to you by the facility where your surgery was performed.  IF YOU HAVE DISABILITY OR FAMILY LEAVE FORMS, YOU MUST BRING THEM TO THE OFFICE FOR PROCESSING.  DO NOT GIVE THEM TO YOUR DOCTOR.  A prescription for pain medication may be given to you upon discharge.  Take your pain medication as prescribed, if needed.  If narcotic pain medicine is not needed, then you may take acetaminophen (Tylenol) or ibuprofen (Advil) as needed. Take your usually prescribed medications unless otherwise directed If you need a refill on your pain medication, please contact your pharmacy.  They will contact our office to request authorization.  Prescriptions will not be filled after 5pm or on week-ends. You should eat very light the first 24 hours after surgery, such as soup, crackers, pudding, etc.  Resume your normal diet the day after surgery. Most patients will experience some swelling and bruising in the breast.  Ice packs and a good support bra will help.  Swelling and bruising can take several days to resolve.  It is common to experience some constipation if taking pain medication after surgery.  Increasing fluid intake and taking a stool softener will usually help or prevent this problem from occurring.  A mild laxative (Milk of Magnesia or Miralax) should be taken according to package directions if there are no bowel movements after 48 hours. Unless discharge instructions indicate otherwise, you may remove your bandages 24-48 hours after surgery, and you may shower at that time.  You may have steri-strips (small skin tapes) in place directly over the incision.  These strips should be left on the skin for 7-10 days.  If your surgeon used skin glue on the incision, you may shower in 24 hours.  The glue will flake off over the next 2-3 weeks.  Any  sutures or staples will be removed at the office during your follow-up visit. ACTIVITIES:  You may resume regular daily activities (gradually increasing) beginning the next day.  Wearing a good support bra or sports bra minimizes pain and swelling.  You may have sexual intercourse when it is comfortable. You may drive when you no longer are taking prescription pain medication, you can comfortably wear a seatbelt, and you can safely maneuver your car and apply brakes. RETURN TO WORK:  ______________________________________________________________________________________ You should see your doctor in the office for a follow-up appointment approximately two weeks after your surgery.  Your doctor's nurse will typically make your follow-up appointment when she calls you with your pathology report.  Expect your pathology report 2-3 business days after your surgery.  You may call to check if you do not hear from us after three days. OTHER INSTRUCTIONS: _______________________________________________________________________________________________ _____________________________________________________________________________________________________________________________________ _____________________________________________________________________________________________________________________________________ _____________________________________________________________________________________________________________________________________  WHEN TO CALL YOUR DOCTOR: Fever over 101.0 Nausea and/or vomiting. Extreme swelling or bruising. Continued bleeding from incision. Increased pain, redness, or drainage from the incision.  The clinic staff is available to answer your questions during regular business hours.  Please don't hesitate to call and ask to speak to one of the nurses for clinical concerns.  If you have a medical emergency, go to the nearest emergency room or call 911.  A surgeon from [facility] is always on call at the hospital.  For further questions, please visit [email or web address]    Post Anesthesia Home Care Instructions  Activity: Get plenty of rest for the remainder of   the day. A responsible individual must stay with you for 24 hours following the procedure.  For the next 24 hours, DO NOT: -Drive a car -Operate machinery -Drink alcoholic beverages -Take any medication unless instructed by your physician -Make any legal decisions or sign important papers.  Meals: Start with liquid foods such as gelatin or soup. Progress to regular foods as tolerated. Avoid greasy, spicy, heavy foods. If nausea and/or vomiting occur, drink only clear liquids until the nausea and/or vomiting subsides. Call your physician if vomiting continues.  Special Instructions/Symptoms: Your throat may feel dry or sore from the anesthesia or the breathing tube placed in your throat during surgery. If this causes discomfort, gargle with warm salt water. The discomfort should disappear within 24 hours.  If you had a scopolamine patch placed behind your ear for the management of post- operative nausea and/or vomiting:  1. The medication in the patch is effective for 72 hours, after which it should be removed.  Wrap patch in a tissue and discard in the trash. Wash hands thoroughly with soap and water. 2. You may remove the patch earlier than 72 hours if you experience unpleasant side effects which may include dry mouth, dizziness or visual disturbances. 3. Avoid touching the patch. Wash your hands with soap and water after contact with the patch.      

## 2024-06-11 NOTE — Interval H&P Note (Signed)
 History and Physical Interval Note:  06/11/2024 8:30 AM  Andrea Henderson  has presented today for surgery, with the diagnosis of RIGHT ATYPICAL DUCTAL HYPERPLASIA.  The various methods of treatment have been discussed with the patient and family. After consideration of risks, benefits and other options for treatment, the patient has consented to  Procedure(s) with comments: BIOPSY, BREAST WITH MAGNETIC MARKER LOCALIZATION (Right) - RIGHT BREAST SEED LUMPECTOMY as a surgical intervention.  The patient's history has been reviewed, patient examined, no change in status, stable for surgery.  I have reviewed the patient's chart and labs.  Questions were answered to the patient's satisfaction.     Shatiqua Heroux A Wendell Nicoson

## 2024-06-11 NOTE — Anesthesia Procedure Notes (Signed)
 Procedure Name: LMA Insertion Date/Time: 06/11/2024 9:09 AM  Performed by: Leotha Andrez DEL, CRNAPre-anesthesia Checklist: Patient identified, Emergency Drugs available, Suction available, Patient being monitored and Timeout performed Patient Re-evaluated:Patient Re-evaluated prior to induction Oxygen Delivery Method: Circle system utilized Preoxygenation: Pre-oxygenation with 100% oxygen Induction Type: IV induction Ventilation: Mask ventilation without difficulty LMA: LMA inserted LMA Size: 3.0 Number of attempts: 1 Placement Confirmation: breath sounds checked- equal and bilateral and positive ETCO2 Tube secured with: Tape Dental Injury: Teeth and Oropharynx as per pre-operative assessment

## 2024-06-11 NOTE — Transfer of Care (Signed)
 Immediate Anesthesia Transfer of Care Note  Patient: Andrea Henderson  Procedure(s) Performed: BIOPSY, BREAST WITH MAGNETIC MARKER LOCALIZATION (Right)  Patient Location: PACU  Anesthesia Type:General  Level of Consciousness: drowsy and patient cooperative  Airway & Oxygen Therapy: Patient Spontanous Breathing and Patient connected to face mask oxygen  Post-op Assessment: Report given to RN and Post -op Vital signs reviewed and stable  Post vital signs: Reviewed and stable  Last Vitals:  Vitals Value Taken Time  BP 110/68 06/11/24 10:15  Temp 36.1 C 06/11/24 09:58  Pulse 57 06/11/24 10:22  Resp 16 06/11/24 10:22  SpO2 100 % 06/11/24 10:22  Vitals shown include unfiled device data.  Last Pain:  Vitals:   06/11/24 1000  TempSrc:   PainSc: (P) 0-No pain      Patients Stated Pain Goal: 3 (06/11/24 0734)  Complications: No notable events documented.

## 2024-06-13 LAB — SURGICAL PATHOLOGY

## 2024-06-14 ENCOUNTER — Ambulatory Visit: Payer: Self-pay | Admitting: Surgery

## 2024-07-13 DIAGNOSIS — R609 Edema, unspecified: Secondary | ICD-10-CM | POA: Diagnosis not present

## 2024-07-13 DIAGNOSIS — Z79899 Other long term (current) drug therapy: Secondary | ICD-10-CM | POA: Diagnosis not present

## 2024-07-13 DIAGNOSIS — F324 Major depressive disorder, single episode, in partial remission: Secondary | ICD-10-CM | POA: Diagnosis not present

## 2024-07-13 DIAGNOSIS — E039 Hypothyroidism, unspecified: Secondary | ICD-10-CM | POA: Diagnosis not present

## 2024-07-13 DIAGNOSIS — F419 Anxiety disorder, unspecified: Secondary | ICD-10-CM | POA: Diagnosis not present

## 2024-07-13 DIAGNOSIS — Z8249 Family history of ischemic heart disease and other diseases of the circulatory system: Secondary | ICD-10-CM | POA: Diagnosis not present

## 2024-07-13 DIAGNOSIS — Z823 Family history of stroke: Secondary | ICD-10-CM | POA: Diagnosis not present

## 2024-07-13 DIAGNOSIS — K219 Gastro-esophageal reflux disease without esophagitis: Secondary | ICD-10-CM | POA: Diagnosis not present

## 2024-07-13 DIAGNOSIS — J309 Allergic rhinitis, unspecified: Secondary | ICD-10-CM | POA: Diagnosis not present

## 2024-08-27 ENCOUNTER — Ambulatory Visit: Payer: Self-pay | Admitting: Obstetrics and Gynecology

## 2024-09-19 ENCOUNTER — Other Ambulatory Visit (HOSPITAL_COMMUNITY): Payer: Self-pay | Admitting: Internal Medicine

## 2024-09-19 NOTE — Progress Notes (Signed)
 Received Prolia  referral.  Last dose 02/27/2024. Pt was due in Dec 2025.  Calcium 05/16/24 wnl (attached to referral) Calcium on 06/06/24 also normal (in Epic)  Sherry Pennant, PharmD, MPH, BCPS, CPP Clinical Pharmacist

## 2024-09-20 ENCOUNTER — Telehealth (HOSPITAL_COMMUNITY): Payer: Self-pay | Admitting: Pharmacy Technician

## 2024-09-20 NOTE — Telephone Encounter (Signed)
 NOTE: Per [facility], MD approved biosimilars switch for Prolia  per payor preferences.  Auth Submission: APPROVED Site of care: [facility] Payer: [employer] MEDICARE Medication & CPT/J Code(s) submitted: Jubbonti (denosumab -bbdz) V4863 Diagnosis Code: M81.0 Route of submission (phone, fax, portal): CMM Mcdiarmid: [id number] Phone # Fax # Auth type: Buy/Bill HB Units/visits requested: 60mg  x 2 doses, q 6 months Reference number: [id number] Auth # [id number] Approval from: 09/20/24 to 09/11/25    Dagoberto Armour, CPhT [facility] Phone: [phone] 09/20/2024

## 2024-09-23 ENCOUNTER — Other Ambulatory Visit (HOSPITAL_COMMUNITY): Payer: Self-pay | Admitting: Internal Medicine

## 2024-10-02 ENCOUNTER — Ambulatory Visit (HOSPITAL_COMMUNITY)
Admission: RE | Admit: 2024-10-02 | Discharge: 2024-10-02 | Disposition: A | Discharge status: Home / Self Care | Source: Ambulatory Visit | Attending: Internal Medicine | Admitting: Internal Medicine

## 2024-10-02 VITALS — BP 110/74 | HR 71 | Temp 97.9°F | Resp 16

## 2024-10-02 DIAGNOSIS — Z79620 Long term (current) use of immunosuppressive biologic: Secondary | ICD-10-CM | POA: Insufficient documentation

## 2024-10-02 DIAGNOSIS — M810 Age-related osteoporosis without current pathological fracture: Secondary | ICD-10-CM | POA: Diagnosis present

## 2024-10-02 MED ORDER — DENOSUMAB-BBDZ 60 MG/ML ~~LOC~~ SOSY
PREFILLED_SYRINGE | SUBCUTANEOUS | Status: AC
  Filled 2024-10-02: qty 1

## 2024-10-02 MED ORDER — DENOSUMAB-BBDZ 60 MG/ML ~~LOC~~ SOSY
60.0000 mg | PREFILLED_SYRINGE | Freq: Once | SUBCUTANEOUS | Status: AC
  Administered 2024-10-02: 60 mg via SUBCUTANEOUS

## 2025-04-02 ENCOUNTER — Encounter (HOSPITAL_COMMUNITY)
